# Patient Record
Sex: Male | Born: 1980 | Race: White | Hispanic: No | Marital: Married | Smoking: Never smoker
Health system: Southern US, Community
[De-identification: ages and names within clinical notes are randomized; demographics above are authoritative.]

## PROBLEM LIST (undated history)

## (undated) DIAGNOSIS — B019 Varicella without complication: Secondary | ICD-10-CM

## (undated) HISTORY — DX: Varicella without complication: B01.9

---

## 2009-07-09 HISTORY — PX: REFRACTIVE SURGERY: SHX103

## 2010-05-25 ENCOUNTER — Emergency Department (HOSPITAL_COMMUNITY): Admission: EM | Admit: 2010-05-25 | Discharge: 2010-05-25 | Payer: Self-pay | Admitting: Emergency Medicine

## 2010-11-23 ENCOUNTER — Ambulatory Visit
Admission: RE | Admit: 2010-11-23 | Discharge: 2010-11-23 | Disposition: A | Discharge status: Home / Self Care | Payer: No Typology Code available for payment source | Source: Ambulatory Visit | Attending: Occupational Medicine | Admitting: Occupational Medicine

## 2010-11-23 ENCOUNTER — Other Ambulatory Visit: Payer: Self-pay | Admitting: Occupational Medicine

## 2010-11-23 DIAGNOSIS — Z0000 Encounter for general adult medical examination without abnormal findings: Secondary | ICD-10-CM

## 2011-09-18 ENCOUNTER — Other Ambulatory Visit: Payer: Self-pay | Admitting: Occupational Medicine

## 2011-09-18 ENCOUNTER — Ambulatory Visit (INDEPENDENT_AMBULATORY_CARE_PROVIDER_SITE_OTHER): Payer: Self-pay | Admitting: Family Medicine

## 2011-09-18 ENCOUNTER — Encounter: Payer: Self-pay | Admitting: Family Medicine

## 2011-09-18 ENCOUNTER — Ambulatory Visit
Admission: RE | Admit: 2011-09-18 | Discharge: 2011-09-18 | Disposition: A | Discharge status: Home / Self Care | Payer: No Typology Code available for payment source | Source: Ambulatory Visit | Attending: Occupational Medicine | Admitting: Occupational Medicine

## 2011-09-18 VITALS — BP 139/77 | HR 86 | Temp 98.1°F | Ht 72.0 in | Wt 185.0 lb

## 2011-09-18 DIAGNOSIS — S8992XA Unspecified injury of left lower leg, initial encounter: Secondary | ICD-10-CM

## 2011-09-18 DIAGNOSIS — R609 Edema, unspecified: Secondary | ICD-10-CM

## 2011-09-18 DIAGNOSIS — R52 Pain, unspecified: Secondary | ICD-10-CM

## 2011-09-18 DIAGNOSIS — S99929A Unspecified injury of unspecified foot, initial encounter: Secondary | ICD-10-CM
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
## 2011-09-18 NOTE — Assessment & Plan Note (Signed)
consistent with Grade 1-2 MCL sprain.  Meniscal testing is negative as is testing of other knee ligaments.  To use immobilizer as needed.  Will take likely 2 weeks to recover, up to 4 weeks.  Discussed possibility of having a small meniscal tear but his exam is negative for this currently outside of medial pain.  ACL/PCL feel intact as well.  Agree with light duty for 2 weeks, relative rest, elevation.  F/u in 2 weeks or as needed.  Will continue regular follow-up with [facility].

## 2011-09-18 NOTE — Patient Instructions (Signed)
You have a mild MCL sprain of your left knee. At worst this is a grade 2 and can take up to 4 weeks to fully recover;  More likely you'll be back to normal in 2 weeks. Your ACL is intact and your meniscus testing is negative. Ice knee 15 minutes at a time as needed. Follow through with light duty for next 2 weeks. Wear knee brace at bedtime and when up/walking around if your knee feels unstable. Aleve 1-2 tabs twice a day as needed for pain/swelling. Follow up with me in 2 weeks or as needed. No cutting, jumping, twisting activities for next 2 weeks.

## 2012-08-09 HISTORY — PX: VASECTOMY: SHX75

## 2012-09-06 IMAGING — CR DG KNEE COMPLETE 4+V*L*
4 series · 4 of 4 positions shown · non-contrast
Comparison: None.

CLINICAL DATA: Left knee pain, injured running several days ago

LEFT KNEE - COMPLETE 4+ VIEW

[view not recorded (1 of 4)]
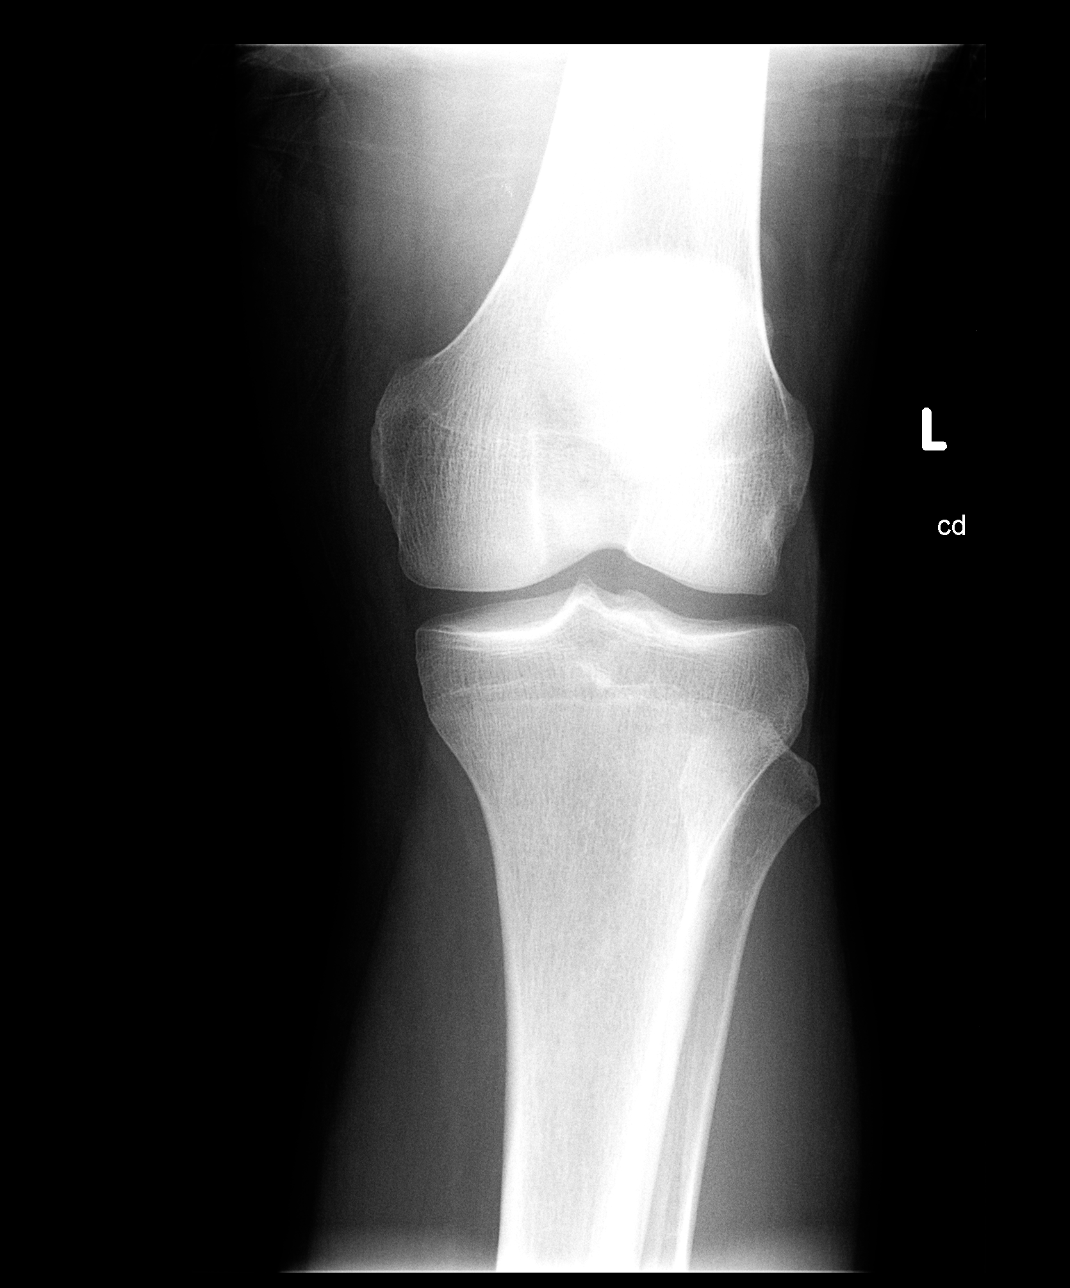

[view not recorded (2 of 4)]
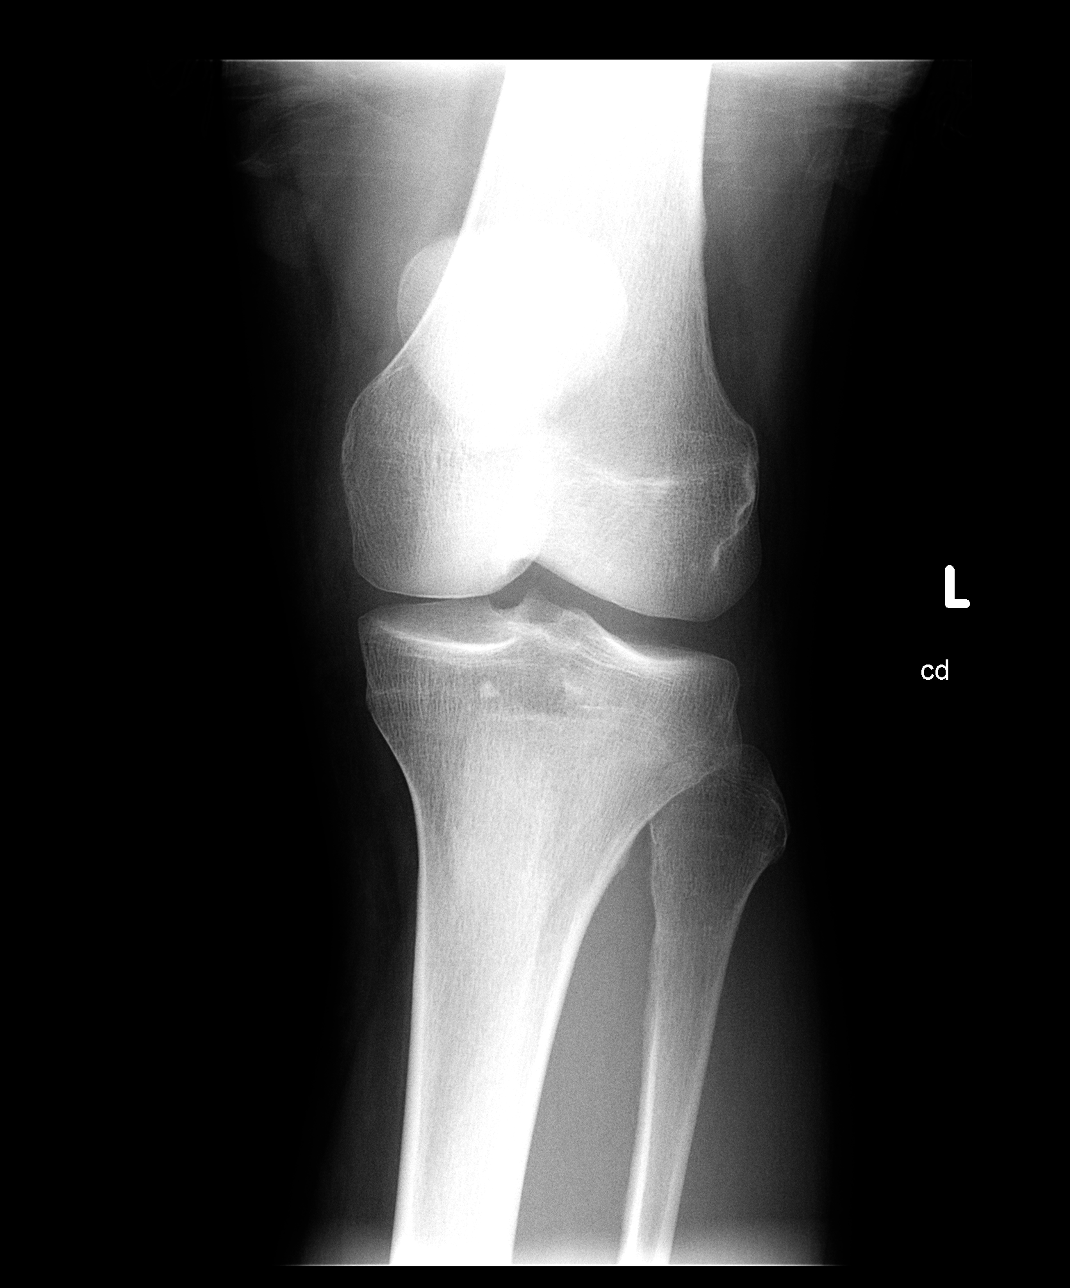

[view not recorded (3 of 4)]
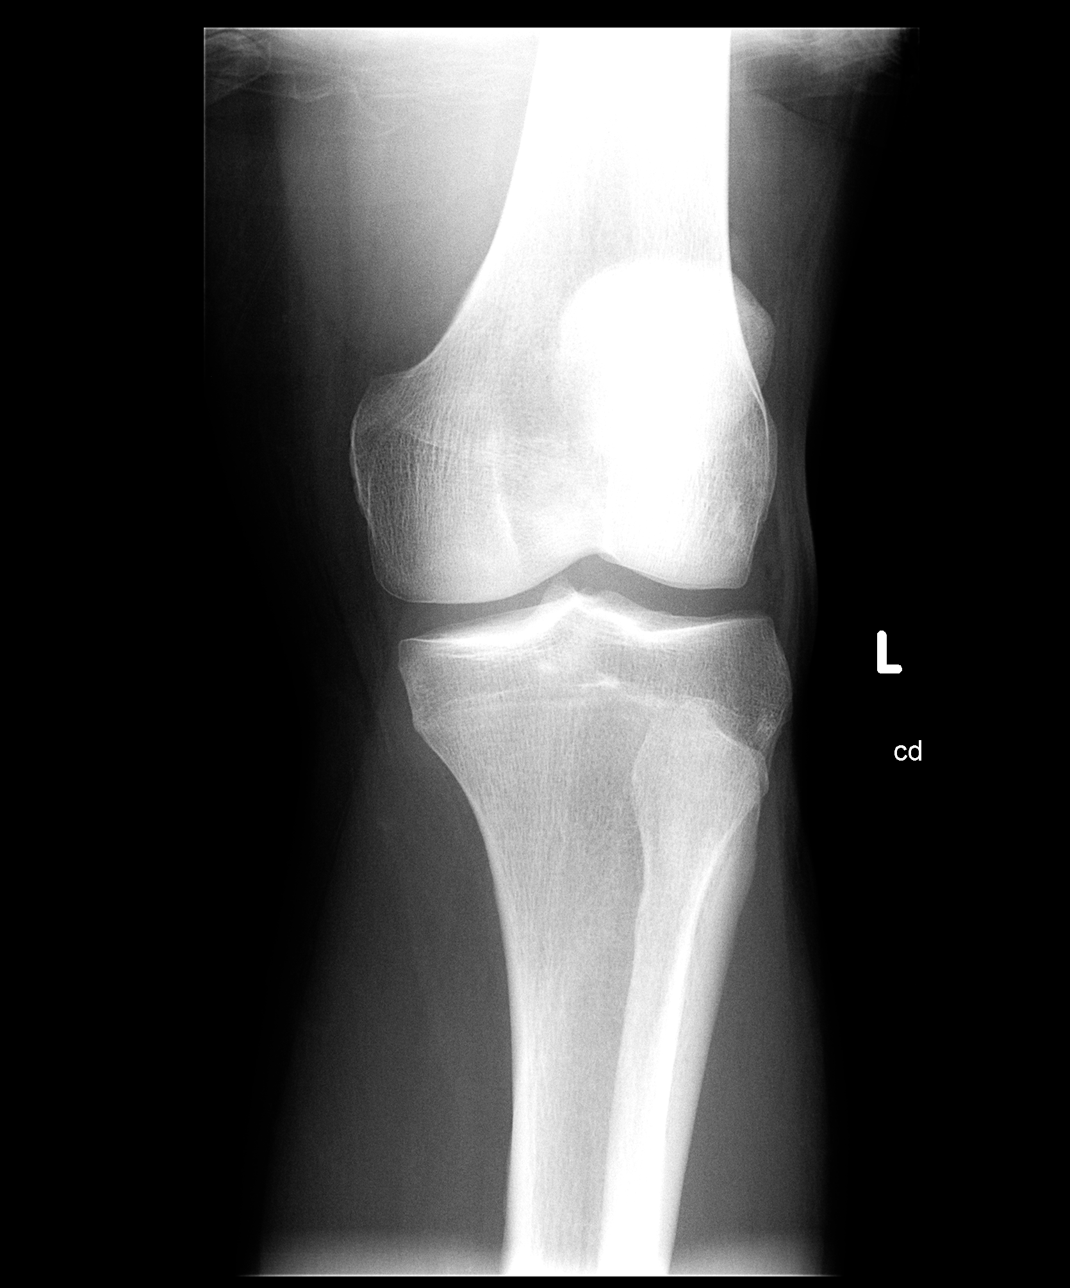

[view not recorded (4 of 4)]
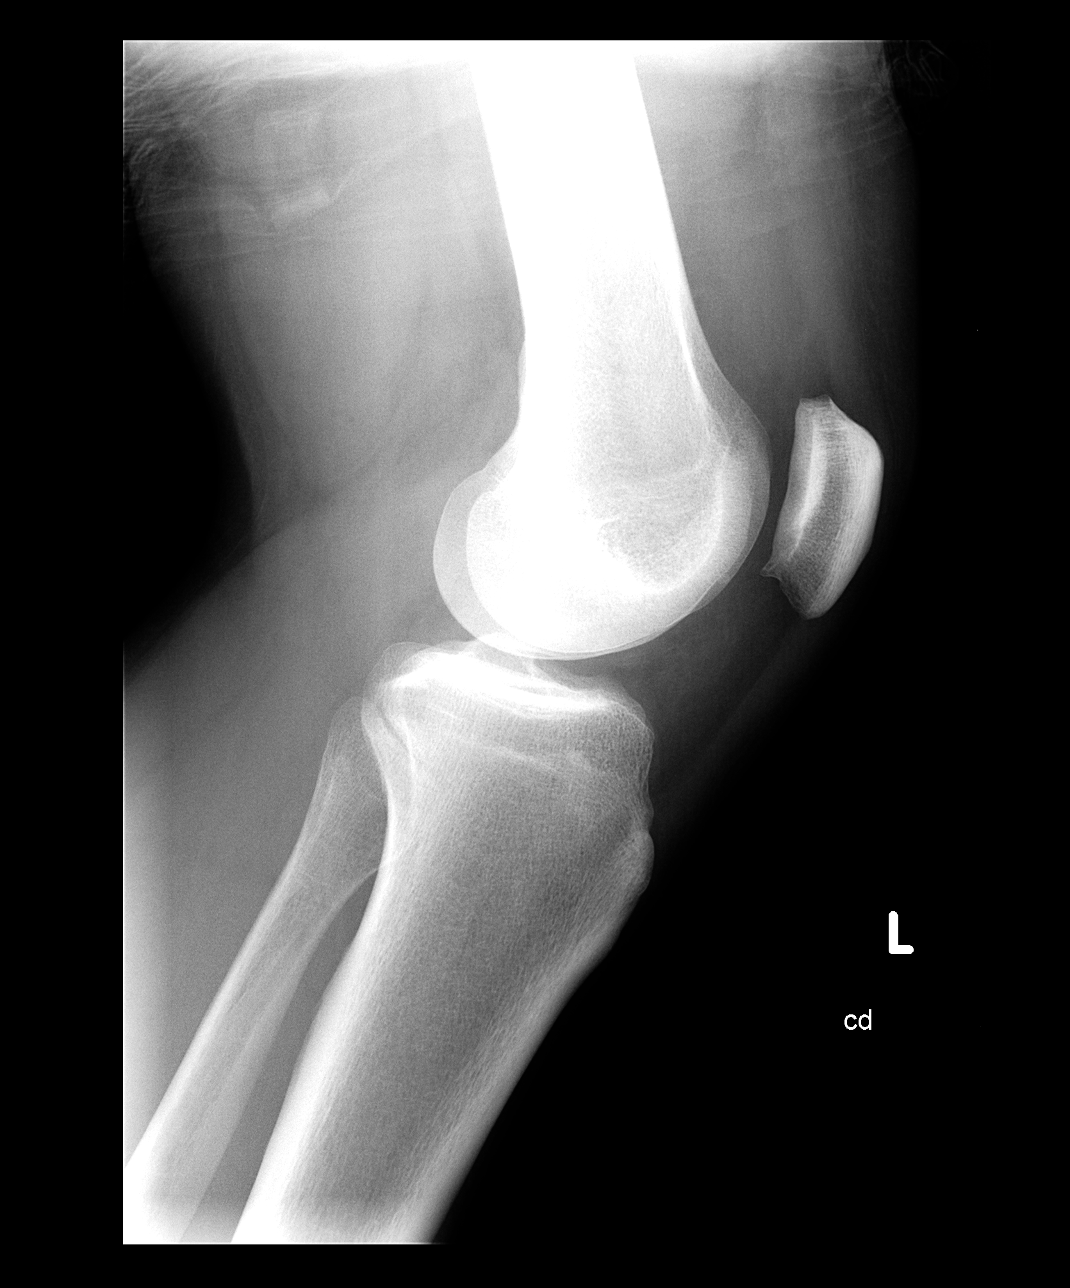

[4 of 4 positions shown; findings below may reference images not displayed]

FINDINGS: The knee joint spaces are well preserved and normal.  No
fracture is seen.  There may be a small knee joint effusion
present.
IMPRESSION: No fracture.  Cannot exclude small knee joint effusion.

## 2013-04-15 ENCOUNTER — Ambulatory Visit: Payer: Self-pay | Admitting: Physician Assistant

## 2013-04-17 ENCOUNTER — Ambulatory Visit (HOSPITAL_BASED_OUTPATIENT_CLINIC_OR_DEPARTMENT_OTHER)
Admission: RE | Admit: 2013-04-17 | Discharge: 2013-04-17 | Disposition: A | Discharge status: Home / Self Care | Payer: 59 | Source: Ambulatory Visit | Attending: Physician Assistant | Admitting: Physician Assistant

## 2013-04-17 ENCOUNTER — Ambulatory Visit (INDEPENDENT_AMBULATORY_CARE_PROVIDER_SITE_OTHER): Payer: 59 | Admitting: Physician Assistant

## 2013-04-17 ENCOUNTER — Encounter: Payer: Self-pay | Admitting: Physician Assistant

## 2013-04-17 ENCOUNTER — Telehealth: Payer: Self-pay | Admitting: Physician Assistant

## 2013-04-17 ENCOUNTER — Other Ambulatory Visit: Payer: Self-pay | Admitting: Physician Assistant

## 2013-04-17 VITALS — BP 108/72 | HR 57 | Temp 98.3°F | Resp 16 | Ht 72.0 in | Wt 192.2 lb

## 2013-04-17 DIAGNOSIS — N453 Epididymo-orchitis: Secondary | ICD-10-CM

## 2013-04-17 DIAGNOSIS — IMO0002 Reserved for concepts with insufficient information to code with codable children: Secondary | ICD-10-CM

## 2013-04-17 DIAGNOSIS — N509 Disorder of male genital organs, unspecified: Secondary | ICD-10-CM | POA: Insufficient documentation

## 2013-04-17 DIAGNOSIS — N451 Epididymitis: Secondary | ICD-10-CM

## 2013-04-17 DIAGNOSIS — Z0000 Encounter for general adult medical examination without abnormal findings: Secondary | ICD-10-CM

## 2013-04-17 DIAGNOSIS — IMO0001 Reserved for inherently not codable concepts without codable children: Secondary | ICD-10-CM

## 2013-04-17 DIAGNOSIS — S8992XS Unspecified injury of left lower leg, sequela: Secondary | ICD-10-CM

## 2013-04-17 DIAGNOSIS — Z9852 Vasectomy status: Secondary | ICD-10-CM | POA: Insufficient documentation

## 2013-04-17 DIAGNOSIS — R1031 Right lower quadrant pain: Secondary | ICD-10-CM

## 2013-04-17 LAB — POCT URINALYSIS DIPSTICK
Blood, UA: NEGATIVE
Glucose, UA: NEGATIVE
Protein, UA: NEGATIVE
Spec Grav, UA: 1.015
Urobilinogen, UA: 0.2

## 2013-04-17 MED ORDER — LEVOFLOXACIN 500 MG PO TABS: 500.0000 mg | ORAL_TABLET | Freq: Every day | ORAL | Status: DC

## 2013-04-17 NOTE — Patient Instructions (Signed)
Please go downstairs to Imaging department.  They will do a scrotal US at 4 pm.  Please take antibiotic as prescribed until all pills are gone.  I will test your urine for other causes of infection.  If they are positive we will treat accordingly.  Please continue Ibuprofen, Ice to testicle, and it would be beneficial to wear briefs to add support to the testicles.  Please return to office for fasting lab work.  We will call you with your results.

## 2013-04-17 NOTE — Progress Notes (Signed)
Patient ID: Javier Hinton, male   DOB: 1981-04-26, 32 y.o.   MRN: [id number]  Patient presents to clinic today to establish care.  Acute Concerns: Patient complains of pain of lower abdomen and right testicle x 1+ weeks.  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  Everything fine the day of assessment (Monday).Tuesday and that Wednesday he felt fine.  On wakingThurdsay he noticed extreme right testicle pain.  Hurt all day.  Friday, pain was slightly better but pain in abdomen.  The next day he went to gym before work.  States he felt fine.  Later in the day intense pain.  Aleve -- some relief.  No heavy lifting for past 2 days, pain is improving.  No swelling, mass noted by patient.  Endorses tenderness to palpation. Denies fever, chills, sweats, change to urinary or bowel habits.  Denies dysuria, hematuria, flank pain.  Denies history of hernia.  Chronic Issues: (1) Left Knee Injury -- 2013; no fracture or torn ligament; asymptomatic.  Health Maintenance: Dental -- 6 month check up -- no issues Vision -- 1 year ago.  No issues -- had Lasix Immunizations --  Tetanus 2012.  Declines flu shot.   Past Medical History  Diagnosis Date  . Chicken pox     No current outpatient prescriptions on file prior to visit.   No current facility-administered medications on file prior to visit.    No Known Allergies  Family History  Problem Relation Age of Onset  . Diabetes Neg Hx   . Heart attack Neg Hx   . Hyperlipidemia Neg Hx   . Hypertension Father   . Sudden death Neg Hx   . Healthy Mother   . COPD Maternal Grandfather   . Kidney cancer Father     2010  . Liver disease Maternal Aunt   . Healthy Sister     x2    History   Social History  . Marital Status: Married    Spouse Name: N/A    Number of Children: N/A  . Years of Education: N/A   Social History Main Topics  . Smoking status: Never Smoker   . Smokeless tobacco: Current User    Types:  Chew  . Alcohol Use: 3.6 oz/week    6 Cans of beer per week  . Drug Use: No  . Sexual Activity: Yes    Birth Control/ Protection: Surgical   Other Topics Concern  . None   Social History Narrative  . None   Review of Systems  Constitutional: Negative for fever, chills, weight loss and malaise/fatigue.  HENT: Negative for ear discharge, ear pain, hearing loss and tinnitus.   Eyes: Negative for blurred vision, double vision, photophobia and pain.  Respiratory: Negative for cough, shortness of breath and wheezing.   Cardiovascular: Negative for chest pain and palpitations.  Gastrointestinal: Negative for heartburn, nausea, vomiting, abdominal pain, diarrhea, constipation, blood in stool and melena.  Genitourinary: Negative for dysuria, urgency, frequency, hematuria and flank pain.  Musculoskeletal: Negative for joint pain.  Neurological: Negative for dizziness, seizures, loss of consciousness and headaches.  Endo/Heme/Allergies: Negative for environmental allergies.  Psychiatric/Behavioral: Negative for depression, suicidal ideas, hallucinations and substance abuse. The patient is not nervous/anxious and does not have insomnia.    Filed Vitals:   04/17/13 1433  BP: 108/72  Pulse: 57  Temp: 98.3 F (36.8 C)  Resp: 16    Physical Exam  Vitals reviewed. Constitutional: He is oriented to person,  place, and time and well-developed, well-nourished, and in no distress.  HENT:  Head: Normocephalic and atraumatic.  Right Ear: External ear normal.  Left Ear: External ear normal.  Nose: Nose normal.  Mouth/Throat: Oropharynx is clear and moist. No oropharyngeal exudate.  Tympanic membranes within normal limits bilaterally.  Eyes: Conjunctivae and EOM are normal. Pupils are equal, round, and reactive to light.  Neck: Normal range of motion. Neck supple.  Cardiovascular: Normal rate, regular rhythm, normal heart sounds and intact distal pulses.   Pulmonary/Chest: Effort normal and  breath sounds normal. No respiratory distress. He has no wheezes. He has no rales. He exhibits no tenderness.  Abdominal: Soft. Bowel sounds are normal. He exhibits no distension and no mass. There is no tenderness. There is no rebound and no guarding.  Genitourinary: Penis normal. He exhibits testicular tenderness and epididymal tenderness. He exhibits no abnormal testicular mass, no abnormal scrotal mass and no scrotal tenderness. No discharge found.  Right epididymis enlarged.  Lymphadenopathy:    He has no cervical adenopathy.  Neurological: He is alert and oriented to person, place, and time.  Skin: Skin is warm and dry. No rash noted.  Psychiatric: Affect normal.     Recent Results (from the past 2160 hour(s))  URINALYSIS, ROUTINE W REFLEX MICROSCOPIC     Status: None   Collection Time    04/17/13  3:20 PM      Result Value Range   Color, Urine YELLOW  YELLOW   APPearance CLEAR  CLEAR   Specific Gravity, Urine 1.013  1.005 - 1.030   pH 6.0  5.0 - 8.0   Glucose, UA NEG  NEG mg/dL   Bilirubin Urine NEG  NEG   Ketones, ur NEG  NEG mg/dL   Hgb urine dipstick NEG  NEG   Protein, ur NEG  NEG mg/dL   Urobilinogen, UA 0.2  0.0 - 1.0 mg/dL   Nitrite NEG  NEG   Leukocytes, UA NEG  NEG  POCT URINALYSIS DIPSTICK     Status: None   Collection Time    04/17/13  3:29 PM      Result Value Range   Color, UA Golden     Clarity, UA clear     Glucose, UA neg     Bilirubin, UA neg     Ketones, UA neg     Spec Grav, UA 1.015     Blood, UA neg     pH, UA 6.0     Protein, UA neg     Urobilinogen, UA 0.2     Nitrite, UA neg     Leukocytes, UA Negative      Assessment/Plan: Epididymitis, right Urine dipstick negative. We'll send for culture and microscopy.  Patient denies concern for STD. Wishes to defer empiric treatment for GC infection until test is obtained. We'll obtain urine GC. Treat empirically with Levaquin non-STD cause of epididymitis. Will obtain scrotal ultrasound to rule  out other mass, hernia or cause for pain.  Ibuprofen for pain. Ice compresses to affected area. Supportive undergarments.  Left knee injury Asymptomatic at present.  Encounter for preventive health examination Patient to return for fasting labs.

## 2013-04-17 NOTE — Telephone Encounter (Signed)
Discussed results of scrotal US with patient.  Urine culture and urine G/C pending.  Continue Levaquin for 10 days.  Ice,  Ibuprofen, supportive underwear.  Will call patient with further results and tailor treatment accordingly.

## 2013-04-18 DIAGNOSIS — Z0000 Encounter for general adult medical examination without abnormal findings: Secondary | ICD-10-CM | POA: Insufficient documentation

## 2013-04-18 DIAGNOSIS — N451 Epididymitis: Secondary | ICD-10-CM | POA: Insufficient documentation

## 2013-04-18 LAB — URINALYSIS, ROUTINE W REFLEX MICROSCOPIC
Hgb urine dipstick: NEGATIVE
Ketones, ur: NEGATIVE mg/dL
Nitrite: NEGATIVE
Urobilinogen, UA: 0.2 mg/dL (ref 0.0–1.0)

## 2013-04-18 NOTE — Assessment & Plan Note (Addendum)
Urine dipstick negative. We'll send for culture and microscopy.  Patient denies concern for STD. Wishes to defer empiric treatment for GC infection until test is obtained. We'll obtain urine GC. Treat empirically with Levaquin non-STD cause of epididymitis. Will obtain scrotal ultrasound to rule out other mass, hernia or cause for pain.  Ibuprofen for pain. Ice compresses to affected area. Supportive undergarments.

## 2013-04-18 NOTE — Assessment & Plan Note (Signed)
Patient to return for fasting labs 

## 2013-04-18 NOTE — Assessment & Plan Note (Signed)
Asymptomatic at present.

## 2013-04-19 LAB — CULTURE, URINE COMPREHENSIVE
Colony Count: NO GROWTH
Organism ID, Bacteria: NO GROWTH

## 2013-04-21 LAB — GC/CHLAMYDIA PROBE AMP: GC Probe RNA: NEGATIVE

## 2013-04-22 ENCOUNTER — Telehealth: Payer: Self-pay | Admitting: *Deleted

## 2013-04-22 DIAGNOSIS — Z0000 Encounter for general adult medical examination without abnormal findings: Secondary | ICD-10-CM

## 2013-04-22 NOTE — Telephone Encounter (Signed)
Message copied by Regis Bill on Wed Apr 22, 2013  9:42 AM ------      Message from: Marcelline Mates      Created: Tue Apr 21, 2013  4:48 PM       Please inform patient that urine G/C were negative.  He should start feeling better with the Levaquin, Ibuprofen, Elevation and Ice compresses.  If symptoms persist despite treatment, he will need a urology referral ------

## 2013-04-22 NOTE — Telephone Encounter (Signed)
Patient informed, understood; future lab orders placed/SLS

## 2013-04-24 LAB — URINALYSIS, ROUTINE W REFLEX MICROSCOPIC
Hgb urine dipstick: NEGATIVE
Ketones, ur: NEGATIVE mg/dL
Nitrite: NEGATIVE
Protein, ur: NEGATIVE mg/dL
Urobilinogen, UA: 0.2 mg/dL (ref 0.0–1.0)

## 2013-04-24 LAB — CBC WITH DIFFERENTIAL/PLATELET
Basophils Relative: 1 % (ref 0–1)
HCT: 44 % (ref 39.0–52.0)
Hemoglobin: 15.3 g/dL (ref 13.0–17.0)
Lymphs Abs: 1.3 10*3/uL (ref 0.7–4.0)
MCHC: 34.8 g/dL (ref 30.0–36.0)
Monocytes Absolute: 0.4 10*3/uL (ref 0.1–1.0)
Monocytes Relative: 8 % (ref 3–12)
Neutro Abs: 2.4 10*3/uL (ref 1.7–7.7)

## 2013-04-24 LAB — HEPATIC FUNCTION PANEL
ALT: 14 U/L (ref 0–53)
AST: 21 U/L (ref 0–37)
Albumin: 4.5 g/dL (ref 3.5–5.2)
Bilirubin, Direct: 0.2 mg/dL (ref 0.0–0.3)
Indirect Bilirubin: 0.6 mg/dL (ref 0.0–0.9)
Total Bilirubin: 0.8 mg/dL (ref 0.3–1.2)
Total Protein: 6.4 g/dL (ref 6.0–8.3)

## 2013-04-24 LAB — BASIC METABOLIC PANEL
BUN: 17 mg/dL (ref 6–23)
CO2: 29 mEq/L (ref 19–32)
Chloride: 103 mEq/L (ref 96–112)
Glucose, Bld: 91 mg/dL (ref 70–99)
Potassium: 5.3 mEq/L (ref 3.5–5.3)

## 2013-04-24 LAB — LIPID PANEL
Cholesterol: 149 mg/dL (ref 0–200)
LDL Cholesterol: 70 mg/dL (ref 0–99)
VLDL: 9 mg/dL (ref 0–40)

## 2013-04-27 NOTE — Telephone Encounter (Signed)
Quick Note:  Called and spoke with pt and pt is aware. ______ 

## 2014-04-06 IMAGING — US US SCROTUM
1 series · 13 of 25 positions shown · non-contrast
Comparison: None.

CLINICAL DATA: 31-year-old male with testicular pain, right greater
than left. History of vasectomy.

EXAM:
SCROTAL ULTRASOUND
DOPPLER ULTRASOUND OF THE TESTICLES
TECHNIQUE: Complete ultrasound examination of the testicles, epididymis, and
other scrotal structures was performed. Color and spectral Doppler
ultrasound were also utilized to evaluate blood flow to the
testicles.

[Series 1: us scrotum · 0.08mm/px · 13 of 28 slices shown]
[im 1/28]
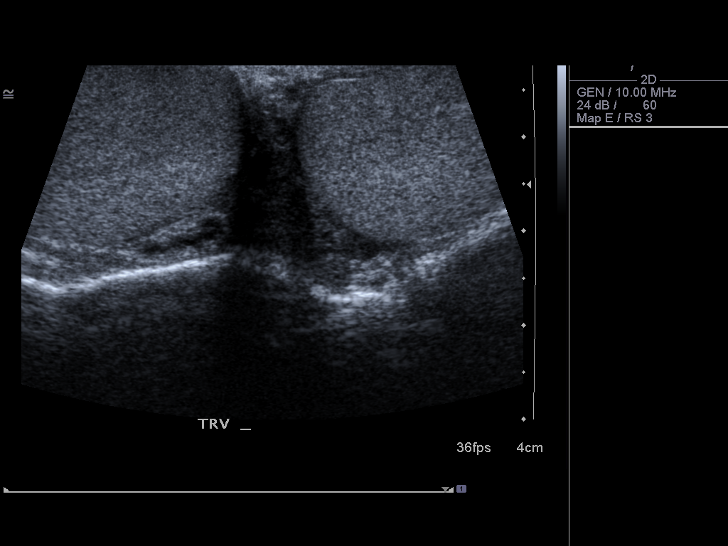
[im 3/28]
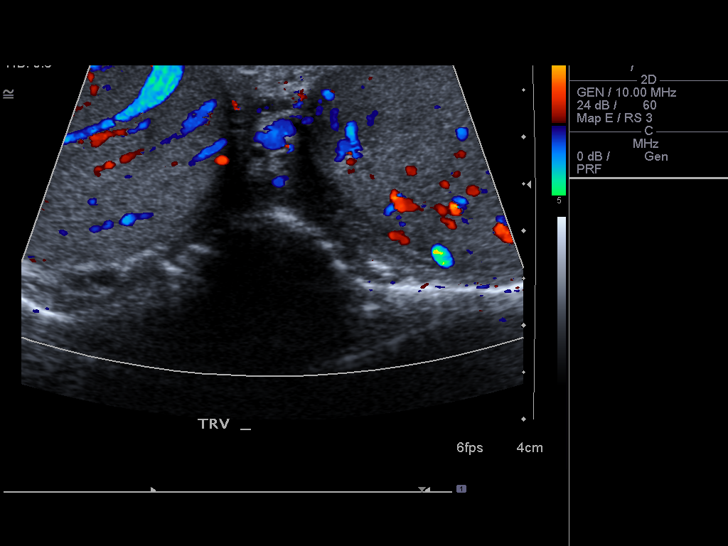
[im 5/28]
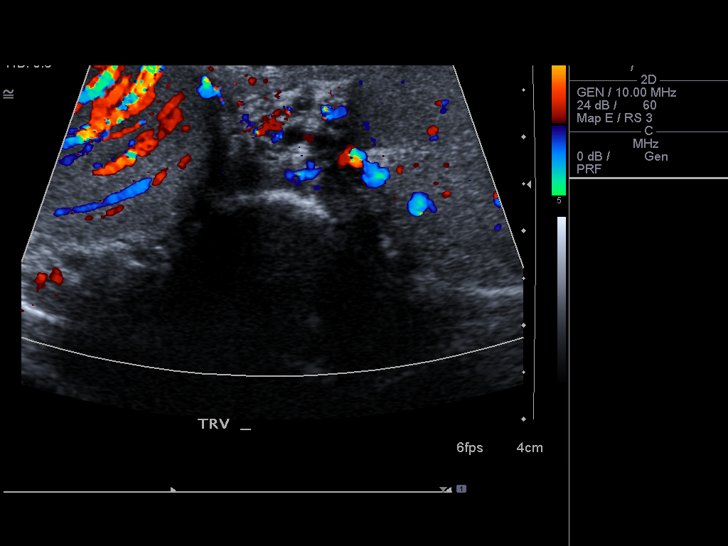
[im 7/28]
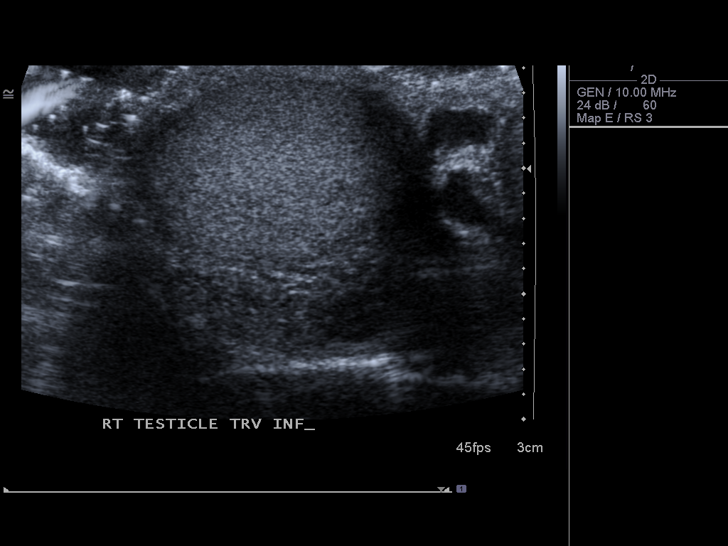
[im 10/28]
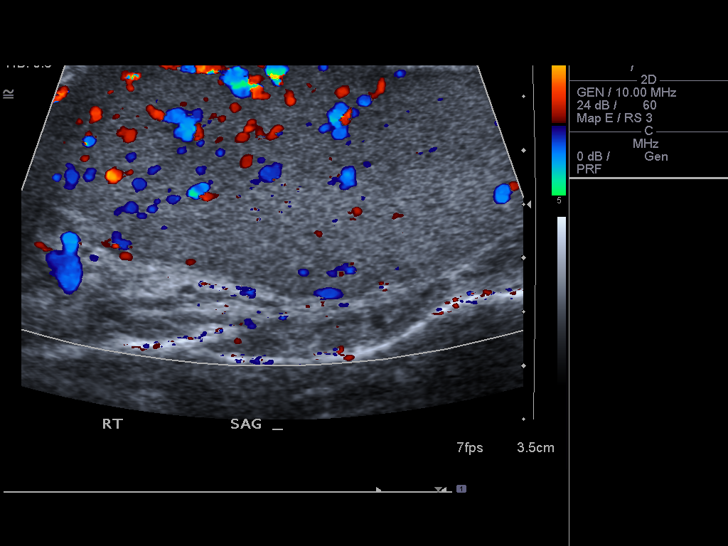
[im 12/28]
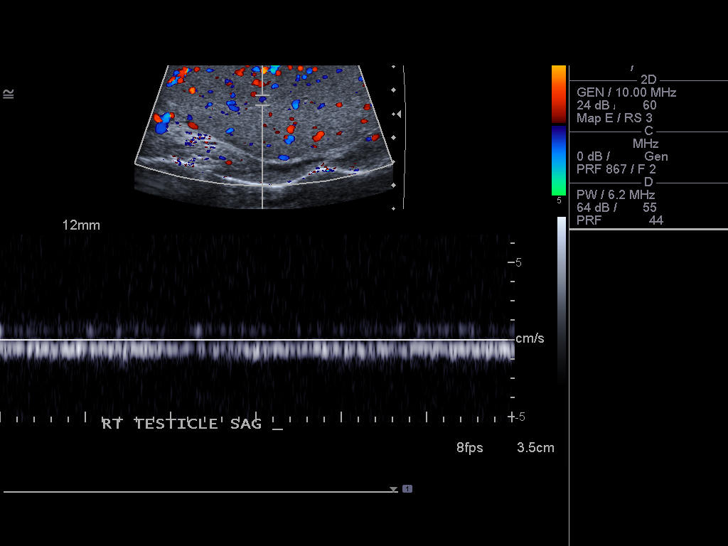
[im 14/28]
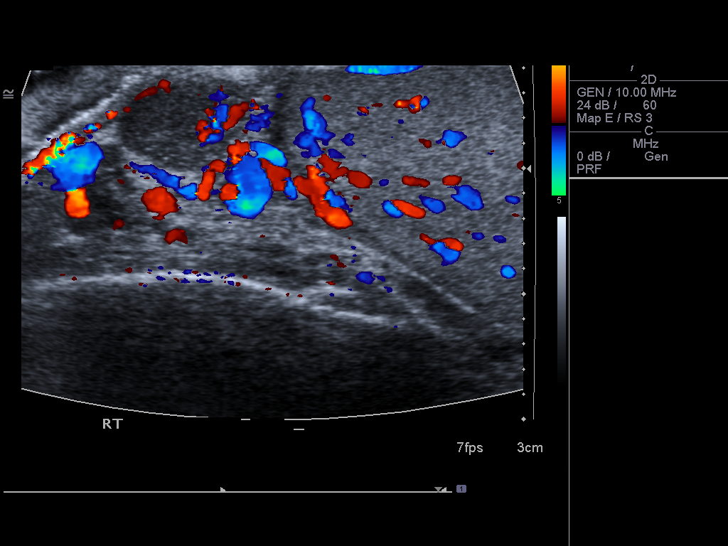
[im 16/28]
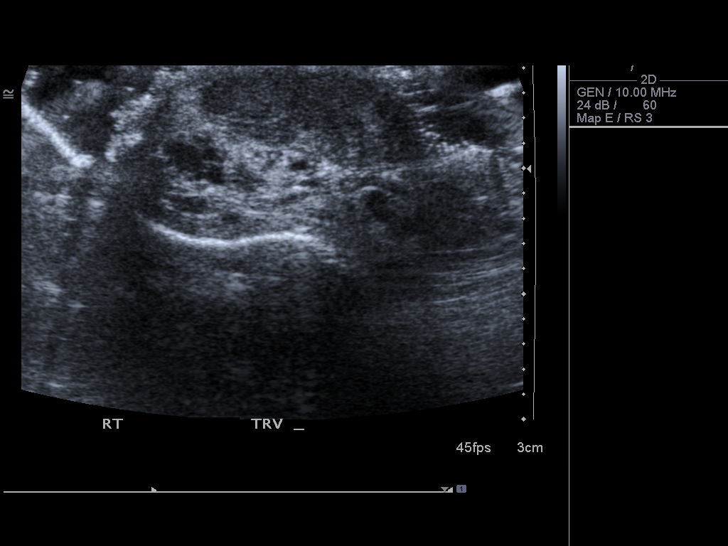
[im 19/28]
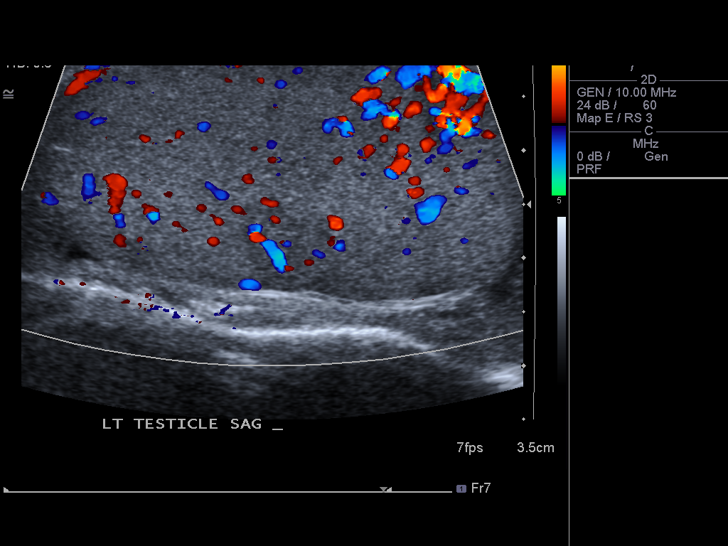
[im 21/28]
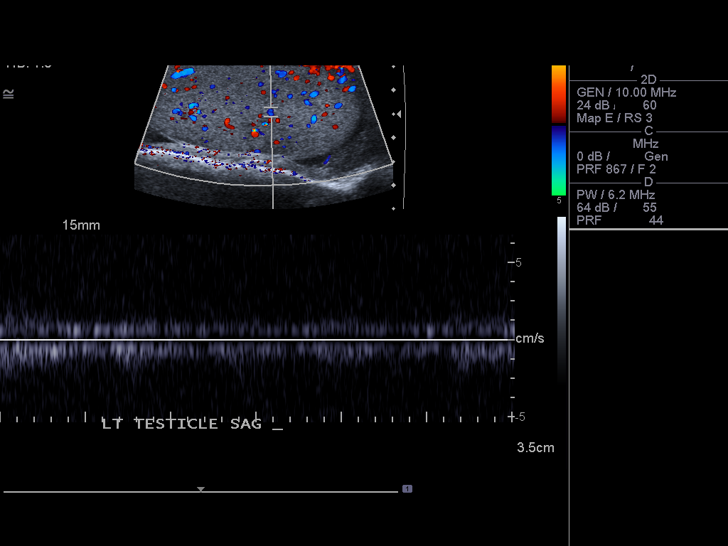
[im 23/28]
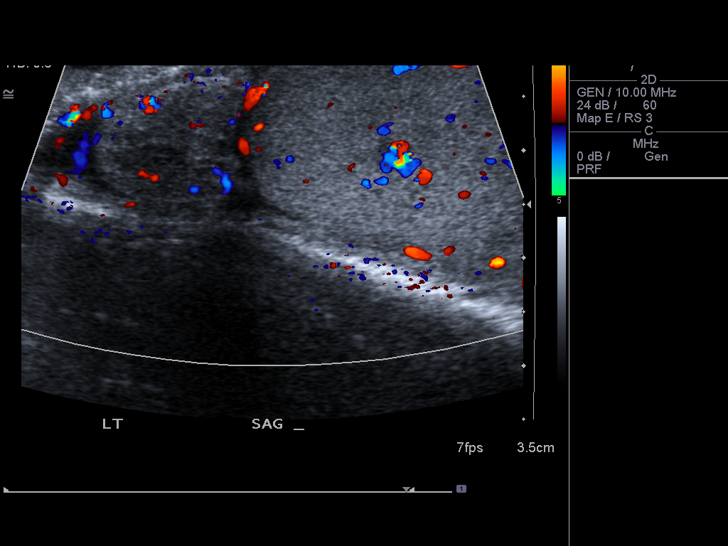
[im 25/28]
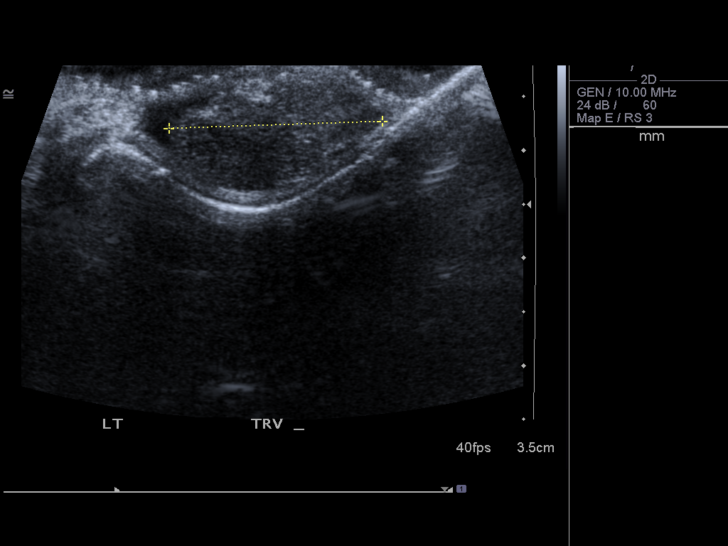
[im 28/28]
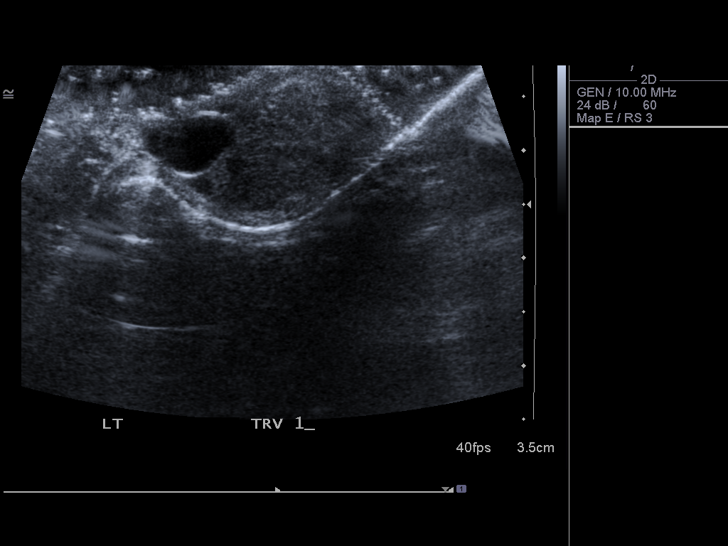

[13 of 25 positions shown; findings below may reference images not displayed]

FINDINGS: Right testis: The right testicle is normal in echogenicity and size.
No focal masses or abnormalities noted.

Color Doppler flow and arterial/venous waveforms are noted. Slightly
increased flow to the right testicle was compared to the left is
noted.

Left testis: Left testicle is normal in echogenicity and size. No
focal masses are abnormalities noted. Normal color Doppler flow and
arterial/ venous waveforms are noted.

Right epididymis: The right epididymis is mildly enlarged with
increased vascularity.

Left epididymis:  A 7 mm spermatocele/epididymal cyst is present.

Hydrocele:  Absent

Varicocele:  Absent

Pulsed Doppler interrogation of both testes demonstrates low
resistance arterial and venous waveforms bilaterally.
IMPRESSION: Mildly prominent right epididymis with increased vascular flow
compatible with epididymitis. Slightly increased vascularity within
the right testicle may represent right orchitis.

No evidence of testicular torsion or focal testicular abnormality.

## 2014-12-19 ENCOUNTER — Emergency Department (HOSPITAL_COMMUNITY)
Admission: EM | Admit: 2014-12-19 | Discharge: 2014-12-19 | Disposition: A | Discharge status: Home / Self Care | Payer: Worker's Compensation | Attending: Emergency Medicine | Admitting: Emergency Medicine

## 2014-12-19 ENCOUNTER — Encounter (HOSPITAL_COMMUNITY): Payer: Self-pay | Admitting: *Deleted

## 2014-12-19 DIAGNOSIS — Z792 Long term (current) use of antibiotics: Secondary | ICD-10-CM | POA: Insufficient documentation

## 2014-12-19 DIAGNOSIS — Z8619 Personal history of other infectious and parasitic diseases: Secondary | ICD-10-CM | POA: Diagnosis not present

## 2014-12-19 DIAGNOSIS — Z0000 Encounter for general adult medical examination without abnormal findings: Secondary | ICD-10-CM | POA: Insufficient documentation

## 2014-12-19 NOTE — Discharge Instructions (Signed)
Health Maintenance A healthy lifestyle and preventative care can promote health and wellness.  Maintain regular health, dental, and eye exams.  Eat a healthy diet. Foods like vegetables, fruits, whole grains, low-fat dairy products, and lean protein foods contain the nutrients you need and are low in calories. Decrease your intake of foods high in solid fats, added sugars, and salt. Get information about a proper diet from your health care provider, if necessary.  Regular physical exercise is one of the most important things you can do for your health. Most adults should get at least 150 minutes of moderate-intensity exercise (any activity that increases your heart rate and causes you to sweat) each week. In addition, most adults need muscle-strengthening exercises on 2 or more days a week.   Maintain a healthy weight. The body mass index (BMI) is a screening tool to identify possible weight problems. It provides an estimate of body fat based on height and weight. Your health care provider can find your BMI and can help you achieve or maintain a healthy weight. For males 20 years and older:  A BMI below 18.5 is considered underweight.  A BMI of 18.5 to 24.9 is normal.  A BMI of 25 to 29.9 is considered overweight.  A BMI of 30 and above is considered obese.  Maintain normal blood lipids and cholesterol by exercising and minimizing your intake of saturated fat. Eat a balanced diet with plenty of fruits and vegetables. Blood tests for lipids and cholesterol should begin at age 74 and be repeated every 5 years. If your lipid or cholesterol levels are high, you are over age 40, or you are at high risk for heart disease, you may need your cholesterol levels checked more frequently.Ongoing high lipid and cholesterol levels should be treated with medicines if diet and exercise are not working.  If you smoke, find out from your health care provider how to quit. If you do not use tobacco, do not  start.  Lung cancer screening is recommended for adults aged 55-80 years who are at high risk for developing lung cancer because of a history of smoking. A yearly low-dose CT scan of the lungs is recommended for people who have at least a 30-pack-year history of smoking and are current smokers or have quit within the past 15 years. A pack year of smoking is smoking an average of 1 pack of cigarettes a day for 1 year (for example, a 30-pack-year history of smoking could mean smoking 1 pack a day for 30 years or 2 packs a day for 15 years). Yearly screening should continue until the smoker has stopped smoking for at least 15 years. Yearly screening should be stopped for people who develop a health problem that would prevent them from having lung cancer treatment.  If you choose to drink alcohol, do not have more than 2 drinks per day. One drink is considered to be 12 oz (360 mL) of beer, 5 oz (150 mL) of wine, or 1.5 oz (45 mL) of liquor.  Avoid the use of street drugs. Do not share needles with anyone. Ask for help if you need support or instructions about stopping the use of drugs.  High blood pressure causes heart disease and increases the risk of stroke. Blood pressure should be checked at least every 1-2 years. Ongoing high blood pressure should be treated with medicines if weight loss and exercise are not effective.  If you are 75-38 years old, ask your health care provider if  you should take aspirin to prevent heart disease.  Diabetes screening involves taking a blood sample to check your fasting blood sugar level. This should be done once every 3 years after age 35 if you are at a normal weight and without risk factors for diabetes. Testing should be considered at a younger age or be carried out more frequently if you are overweight and have at least 1 risk factor for diabetes.  Colorectal cancer can be detected and often prevented. Most routine colorectal cancer screening begins at the age of 35  and continues through age 35. However, your health care provider may recommend screening at an earlier age if you have risk factors for colon cancer. On a yearly basis, your health care provider may provide home test kits to check for hidden blood in the stool. A small camera at the end of a tube may be used to directly examine the colon (sigmoidoscopy or colonoscopy) to detect the earliest forms of colorectal cancer. Talk to your health care provider about this at age 35 when routine screening begins. A direct exam of the colon should be repeated every 5-10 years through age 35, unless early forms of precancerous polyps or small growths are found.  People who are at an increased risk for hepatitis B should be screened for this virus. You are considered at high risk for hepatitis B if:  You were born in a country where hepatitis B occurs often. Talk with your health care provider about which countries are considered high risk.  Your parents were born in a high-risk country and you have not received a shot to protect against hepatitis B (hepatitis B vaccine).  You have HIV or AIDS.  You use needles to inject street drugs.  You live with, or have sex with, someone who has hepatitis B.  You are a man who has sex with other men (MSM).  You get hemodialysis treatment.  You take certain medicines for conditions like cancer, organ transplantation, and autoimmune conditions.  Hepatitis C blood testing is recommended for all people born from [id number] through 1966 and any individual with known risk factors for hepatitis C.  Healthy men should no longer receive prostate-specific antigen (PSA) blood tests as part of routine cancer screening. Talk to your health care provider about prostate cancer screening.  Testicular cancer screening is not recommended for adolescents or adult males who have no symptoms. Screening includes self-exam, a health care provider exam, and other screening tests. Consult with your  health care provider about any symptoms you have or any concerns you have about testicular cancer.  Practice safe sex. Use condoms and avoid high-risk sexual practices to reduce the spread of sexually transmitted infections (STIs).  You should be screened for STIs, including gonorrhea and chlamydia if:  You are sexually active and are younger than 24 years.  You are older than 24 years, and your health care provider tells you that you are at risk for this type of infection.  Your sexual activity has changed since you were last screened, and you are at an increased risk for chlamydia or gonorrhea. Ask your health care provider if you are at risk.  If you are at risk of being infected with HIV, it is recommended that you take a prescription medicine daily to prevent HIV infection. This is called pre-exposure prophylaxis (PrEP). You are considered at risk if:  You are a man who has sex with other men (MSM).  You are a heterosexual man who  is sexually active with multiple partners.  You take drugs by injection.  You are sexually active with a partner who has HIV.  Talk with your health care provider about whether you are at high risk of being infected with HIV. If you choose to begin PrEP, you should first be tested for HIV. You should then be tested every 3 months for as long as you are taking PrEP.  Use sunscreen. Apply sunscreen liberally and repeatedly throughout the day. You should seek shade when your shadow is shorter than you. Protect yourself by wearing long sleeves, pants, a wide-brimmed hat, and sunglasses year round whenever you are outdoors.  Tell your health care provider of new moles or changes in moles, especially if there is a change in shape or color. Also, tell your health care provider if a mole is larger than the size of a pencil eraser.  A one-time screening for abdominal aortic aneurysm (AAA) and surgical repair of large AAAs by ultrasound is recommended for men aged  65-75 years who are current or former smokers.  Stay current with your vaccines (immunizations). Document Released: 12/22/2007 Document Revised: 06/30/2013 Document Reviewed: 11/20/2010 [facility] Patient Information 2015 [place], [place]. This information is not intended to replace advice given to you by your health care provider. Make sure you discuss any questions you have with your health care provider. Stress Stress-related medical problems are becoming increasingly common. The body has a built-in physical response to stressful situations. Faced with pressure, challenge or danger, we need to react quickly. Our bodies release hormones such as cortisol and adrenaline to help do this. These hormones are part of the "fight or flight" response and affect the metabolic rate, heart rate and blood pressure, resulting in a heightened, stressed state that prepares the body for optimum performance in dealing with a stressful situation. It is likely that early man required these mechanisms to stay alive, but usually modern stresses do not call for this, and the same hormones released in today's world can damage health and reduce coping ability. CAUSES  Pressure to perform at work, at school or in sports.  Threats of physical violence.  Money worries.  Arguments.  Family conflicts.  Divorce or separation from significant other.  Bereavement.  New job or unemployment.  Changes in location.  Alcohol or drug abuse. SOMETIMES, THERE IS NO PARTICULAR REASON FOR DEVELOPING STRESS. Almost all people are at risk of being stressed at some time in their lives. It is important to know that some stress is temporary and some is long term.  Temporary stress will go away when a situation is resolved. Most people can cope with short periods of stress, and it can often be relieved by relaxing, taking a walk or getting any type of exercise, chatting through issues with friends, or having a good night's  sleep.  Chronic (long-term, continuous) stress is much harder to deal with. It can be psychologically and emotionally damaging. It can be harmful both for an individual and for friends and family. SYMPTOMS Everyone reacts to stress differently. There are some common effects that help [place] recognize it. In times of extreme stress, people may:  Shake uncontrollably.  Breathe faster and deeper than normal (hyperventilate).  Vomit.  For people with asthma, stress can trigger an attack.  For some people, stress may trigger migraine headaches, ulcers, and body pain. PHYSICAL EFFECTS OF STRESS MAY INCLUDE:  Loss of energy.  Skin problems.  Aches and pains resulting from tense muscles, including neck  ache, backache and tension headaches.  Increased pain from arthritis and other conditions.  Irregular heart beat (palpitations).  Periods of irritability or anger.  Apathy or depression.  Anxiety (feeling uptight or worrying).  Unusual behavior.  Loss of appetite.  Comfort eating.  Lack of concentration.  Loss of, or decreased, sex-drive.  Increased smoking, drinking, or recreational drug use.  For women, missed periods.  Ulcers, joint pain, and muscle pain. Post-traumatic stress is the stress caused by any serious accident, strong emotional damage, or extremely difficult or violent experience such as rape or war. Post-traumatic stress victims can experience mixtures of emotions such as fear, shame, depression, guilt or anger. It may include recurrent memories or images that may be haunting. These feelings can last for weeks, months or even years after the traumatic event that triggered them. Specialized treatment, possibly with medicines and psychological therapies, is available. If stress is causing physical symptoms, severe distress or making it difficult for you to function as normal, it is worth seeing your caregiver. It is important to remember that although stress is a  usual part of life, extreme or prolonged stress can lead to other illnesses that will need treatment. It is better to visit a doctor sooner rather than later. Stress has been linked to the development of high blood pressure and heart disease, as well as insomnia and depression. There is no diagnostic test for stress since everyone reacts to it differently. But a caregiver will be able to spot the physical symptoms, such as:  Headaches.  Shingles.  Ulcers. Emotional distress such as intense worry, low mood or irritability should be detected when the doctor asks pertinent questions to identify any underlying problems that might be the cause. In case there are physical reasons for the symptoms, the doctor may also want to do some tests to exclude certain conditions. If you feel that you are suffering from stress, try to identify the aspects of your life that are causing it. Sometimes you may not be able to change or avoid them, but even a small change can have a positive ripple effect. A simple lifestyle change can make all the difference. STRATEGIES THAT CAN HELP DEAL WITH STRESS:  Delegating or sharing responsibilities.  Avoiding confrontations.  Learning to be more assertive.  Regular exercise.  Avoid using alcohol or street drugs to cope.  Eating a healthy, balanced diet, rich in fruit and vegetables and proteins.  Finding humor or absurdity in stressful situations.  Never taking on more than you know you can handle comfortably.  Organizing your time better to get as much done as possible.  Talking to friends or family and sharing your thoughts and fears.  Listening to music or relaxation tapes.  Relaxation techniques like deep breathing, meditation, and yoga.  Tensing and then relaxing your muscles, starting at the toes and working up to the head and neck. If you think that you would benefit from help, either in identifying the things that are causing your stress or in learning  techniques to help you relax, see a caregiver who is capable of helping you with this. Rather than relying on medications, it is usually better to try and identify the things in your life that are causing stress and try to deal with them. There are many techniques of managing stress including counseling, psychotherapy, aromatherapy, yoga, and exercise. Your caregiver can help you determine what is best for you. Document Released: 09/15/2002 Document Revised: 06/30/2013 Document Reviewed: 08/12/2007 [employer] Patient Information 2015  [employer]. This information is not intended to replace advice given to you by your health care provider. Make sure you discuss any questions you have with your health care provider. Hypertension Hypertension, commonly called high blood pressure, is when the force of blood pumping through your arteries is too strong. Your arteries are the blood vessels that carry blood from your heart throughout your body. A blood pressure reading consists of a higher number over a lower number, such as 110/72. The higher number (systolic) is the pressure inside your arteries when your heart pumps. The lower number (diastolic) is the pressure inside your arteries when your heart relaxes. Ideally you want your blood pressure below 120/80. Hypertension forces your heart to work harder to pump blood. Your arteries may become narrow or stiff. Having hypertension puts you at risk for heart disease, stroke, and other problems.  RISK FACTORS Some risk factors for high blood pressure are controllable. Others are not.  Risk factors you cannot control include:   Race. You may be at higher risk if you are African American.  Age. Risk increases with age.  Gender. Men are at higher risk than women before age 86 years. After age 71, women are at higher risk than men. Risk factors you can control include:  Not getting enough exercise or physical activity.  Being overweight.  Getting too much  fat, sugar, calories, or salt in your diet.  Drinking too much alcohol. SIGNS AND SYMPTOMS Hypertension does not usually cause signs or symptoms. Extremely high blood pressure (hypertensive crisis) may cause headache, anxiety, shortness of breath, and nosebleed. DIAGNOSIS  To check if you have hypertension, your health care provider will measure your blood pressure while you are seated, with your arm held at the level of your heart. It should be measured at least twice using the same arm. Certain conditions can cause a difference in blood pressure between your right and left arms. A blood pressure reading that is higher than normal on one occasion does not mean that you need treatment. If one blood pressure reading is high, ask your health care provider about having it checked again. TREATMENT  Treating high blood pressure includes making lifestyle changes and possibly taking medicine. Living a healthy lifestyle can help lower high blood pressure. You may need to change some of your habits. Lifestyle changes may include:  Following the DASH diet. This diet is high in fruits, vegetables, and whole grains. It is low in salt, red meat, and added sugars.  Getting at least 2 hours of brisk physical activity every week.  Losing weight if necessary.  Not smoking.  Limiting alcoholic beverages.  Learning ways to reduce stress. If lifestyle changes are not enough to get your blood pressure under control, your health care provider may prescribe medicine. You may need to take more than one. Work closely with your health care provider to understand the risks and benefits. HOME CARE INSTRUCTIONS  Have your blood pressure rechecked as directed by your health care provider.   Take medicines only as directed by your health care provider. Follow the directions carefully. Blood pressure medicines must be taken as prescribed. The medicine does not work as well when you skip doses. Skipping doses also puts  you at risk for problems.   Do not smoke.   Monitor your blood pressure at home as directed by your health care provider. SEEK MEDICAL CARE IF:   You think you are having a reaction to medicines taken.  You have  recurrent headaches or feel dizzy.  You have swelling in your ankles.  You have trouble with your vision. SEEK IMMEDIATE MEDICAL CARE IF:  You develop a severe headache or confusion.  You have unusual weakness, numbness, or feel faint.  You have severe chest or abdominal pain.  You vomit repeatedly.  You have trouble breathing. MAKE SURE YOU:   Understand these instructions.  Will watch your condition.  Will get help right away if you are not doing well or get worse. Document Released: 06/25/2005 Document Revised: 11/09/2013 Document Reviewed: 04/17/2013 [facility] Patient Information 2015 [place], [place]. This information is not intended to replace advice given to you by your health care provider. Make sure you discuss any questions you have with your health care provider. DASH Eating Plan DASH stands for "Dietary Approaches to Stop Hypertension." The DASH eating plan is a healthy eating plan that has been shown to reduce high blood pressure (hypertension). Additional health benefits may include reducing the risk of type 2 diabetes mellitus, heart disease, and stroke. The DASH eating plan may also help with weight loss. WHAT DO I NEED TO KNOW ABOUT THE DASH EATING PLAN? For the DASH eating plan, you will follow these general guidelines:  Choose foods with a percent daily value for sodium of less than 5% (as listed on the food label).  Use salt-free seasonings or herbs instead of table salt or sea salt.  Check with your health care provider or pharmacist before using salt substitutes.  Eat lower-sodium products, often labeled as "lower sodium" or "no salt added."  Eat fresh foods.  Eat more vegetables, fruits, and low-fat dairy products.  Choose whole  grains. Look for the word "whole" as the first word in the ingredient list.  Choose fish and skinless chicken or [place] more often than red meat. Limit fish, poultry, and meat to 6 oz (170 g) each day.  Limit sweets, desserts, sugars, and sugary drinks.  Choose heart-healthy fats.  Limit cheese to 1 oz (28 g) per day.  Eat more home-cooked food and less restaurant, buffet, and fast food.  Limit fried foods.  Cook foods using methods other than frying.  Limit canned vegetables. If you do use them, rinse them well to decrease the sodium.  When eating at a restaurant, ask that your food be prepared with less salt, or no salt if possible. WHAT FOODS CAN I EAT? Seek help from a dietitian for individual calorie needs. Grains Whole grain or whole wheat bread. Brown rice. Whole grain or whole wheat pasta. Quinoa, bulgur, and whole grain cereals. Low-sodium cereals. Corn or whole wheat flour tortillas. Whole grain cornbread. Whole grain crackers. Low-sodium crackers. Vegetables Fresh or frozen vegetables (raw, steamed, roasted, or grilled). Low-sodium or reduced-sodium tomato and vegetable juices. Low-sodium or reduced-sodium tomato sauce and paste. Low-sodium or reduced-sodium canned vegetables.  Fruits All fresh, canned (in natural juice), or frozen fruits. Meat and Other Protein Products Ground beef (85% or leaner), grass-fed beef, or beef trimmed of fat. Skinless chicken or [place]. Ground chicken or [place]. Pork trimmed of fat. All fish and seafood. Eggs. Dried beans, peas, or lentils. Unsalted nuts and seeds. Unsalted canned beans. Dairy Low-fat dairy products, such as skim or 1% milk, 2% or reduced-fat cheeses, low-fat ricotta or cottage cheese, or plain low-fat yogurt. Low-sodium or reduced-sodium cheeses. Fats and Oils Tub margarines without trans fats. Light or reduced-fat mayonnaise and salad dressings (reduced sodium). Avocado. Safflower, olive, or canola oils. Natural peanut or  almond butter. Other  Unsalted popcorn and pretzels. The items listed above may not be a complete list of recommended foods or beverages. Contact your dietitian for more options. WHAT FOODS ARE NOT RECOMMENDED? Grains White bread. White pasta. White rice. Refined cornbread. Bagels and croissants. Crackers that contain trans fat. Vegetables Creamed or fried vegetables. Vegetables in a cheese sauce. Regular canned vegetables. Regular canned tomato sauce and paste. Regular tomato and vegetable juices. Fruits Dried fruits. Canned fruit in light or heavy syrup. Fruit juice. Meat and Other Protein Products Fatty cuts of meat. Ribs, chicken wings, bacon, sausage, bologna, salami, chitterlings, fatback, hot dogs, bratwurst, and packaged luncheon meats. Salted nuts and seeds. Canned beans with salt. Dairy Whole or 2% milk, cream, half-and-half, and cream cheese. Whole-fat or sweetened yogurt. Full-fat cheeses or blue cheese. Nondairy creamers and whipped toppings. Processed cheese, cheese spreads, or cheese curds. Condiments Onion and garlic salt, seasoned salt, table salt, and sea salt. Canned and packaged gravies. Worcestershire sauce. Tartar sauce. Barbecue sauce. Teriyaki sauce. Soy sauce, including reduced sodium. Steak sauce. Fish sauce. Oyster sauce. Cocktail sauce. Horseradish. Ketchup and mustard. Meat flavorings and tenderizers. Bouillon cubes. Hot sauce. Tabasco sauce. Marinades. Taco seasonings. Relishes. Fats and Oils Butter, stick margarine, lard, shortening, ghee, and bacon fat. Coconut, palm kernel, or palm oils. Regular salad dressings. Other Pickles and olives. Salted popcorn and pretzels. The items listed above may not be a complete list of foods and beverages to avoid. Contact your dietitian for more information. WHERE CAN I FIND MORE INFORMATION? National Heart, Lung, and Blood Institute: [employer] Document Released: 06/14/2011 Document  Revised: 11/09/2013 Document Reviewed: 04/29/2013 [facility] Patient Information 2015 [place], [place]. This information is not intended to replace advice given to you by your health care provider. Make sure you discuss any questions you have with your health care provider.
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
## 2018-04-23 ENCOUNTER — Ambulatory Visit
Admission: RE | Admit: 2018-04-23 | Discharge: 2018-04-23 | Disposition: A | Discharge status: Home / Self Care | Payer: No Typology Code available for payment source | Source: Ambulatory Visit | Attending: Nurse Practitioner | Admitting: Nurse Practitioner

## 2018-04-23 ENCOUNTER — Other Ambulatory Visit: Payer: Self-pay | Admitting: Nurse Practitioner

## 2018-04-23 DIAGNOSIS — Z0000 Encounter for general adult medical examination without abnormal findings: Secondary | ICD-10-CM

## 2018-08-18 ENCOUNTER — Telehealth: Payer: Self-pay

## 2018-08-18 NOTE — Telephone Encounter (Signed)
Copied from CRM [id number]. Topic: Appointment Scheduling - New Patient >> Aug 18, 2018  1:50 PM Jaquita Rector A wrote: New patient has been scheduled for your office. Provider: saguier Date of Appointment: 08/22/2018  Route to department's PEC pool.

## 2018-08-22 ENCOUNTER — Encounter: Payer: Self-pay | Admitting: Medical

## 2018-08-22 ENCOUNTER — Ambulatory Visit: Payer: 59 | Admitting: Medical

## 2018-08-22 VITALS — BP 117/74 | HR 75 | Temp 98.1°F | Resp 16 | Ht 72.0 in | Wt 211.8 lb

## 2018-08-22 DIAGNOSIS — R109 Unspecified abdominal pain: Secondary | ICD-10-CM | POA: Diagnosis not present

## 2018-08-22 DIAGNOSIS — E785 Hyperlipidemia, unspecified: Secondary | ICD-10-CM | POA: Diagnosis not present

## 2018-08-22 DIAGNOSIS — R195 Other fecal abnormalities: Secondary | ICD-10-CM | POA: Diagnosis not present

## 2018-08-22 LAB — CBC WITH DIFFERENTIAL/PLATELET
Absolute Monocytes: 396 cells/uL (ref 200–950)
Basophils Absolute: 60 cells/uL (ref 0–200)
Basophils Relative: 1.5 %
EOS PCT: 1 %
Eosinophils Absolute: 40 cells/uL (ref 15–500)
HEMATOCRIT: 45.9 % (ref 38.5–50.0)
Hemoglobin: 15.9 g/dL (ref 13.2–17.1)
LYMPHS ABS: 820 {cells}/uL — AB (ref 850–3900)
MCH: 29.8 pg (ref 27.0–33.0)
MCHC: 34.6 g/dL (ref 32.0–36.0)
MCV: 86 fL (ref 80.0–100.0)
MPV: 10.8 fL (ref 7.5–12.5)
Monocytes Relative: 9.9 %
NEUTROS PCT: 67.1 %
Neutro Abs: 2684 cells/uL (ref 1500–7800)
Platelets: 236 10*3/uL (ref 140–400)
RBC: 5.34 10*6/uL (ref 4.20–5.80)
RDW: 12.1 % (ref 11.0–15.0)
Total Lymphocyte: 20.5 %
WBC: 4 10*3/uL (ref 3.8–10.8)

## 2018-08-22 LAB — COMPREHENSIVE METABOLIC PANEL
AG Ratio: 2.6 (calc) — ABNORMAL HIGH (ref 1.0–2.5)
ALT: 14 U/L (ref 9–46)
AST: 17 U/L (ref 10–40)
Albumin: 4.7 g/dL (ref 3.6–5.1)
Alkaline phosphatase (APISO): 48 U/L (ref 36–130)
BILIRUBIN TOTAL: 1 mg/dL (ref 0.2–1.2)
BUN: 15 mg/dL (ref 7–25)
CALCIUM: 9.7 mg/dL (ref 8.6–10.3)
CO2: 29 mmol/L (ref 20–32)
Chloride: 104 mmol/L (ref 98–110)
Creat: 1.26 mg/dL (ref 0.60–1.35)
GLUCOSE: 80 mg/dL (ref 65–99)
Globulin: 1.8 g/dL (calc) — ABNORMAL LOW (ref 1.9–3.7)
POTASSIUM: 5.1 mmol/L (ref 3.5–5.3)
SODIUM: 139 mmol/L (ref 135–146)
TOTAL PROTEIN: 6.5 g/dL (ref 6.1–8.1)

## 2018-08-22 LAB — AMYLASE: AMYLASE: 63 U/L (ref 21–101)

## 2018-08-22 LAB — LIPASE: Lipase: 39 U/L (ref 7–60)

## 2018-08-22 MED ORDER — FAMOTIDINE 20 MG PO TABS: 20.0000 mg | ORAL_TABLET | Freq: Every day | ORAL | 0 refills | Status: AC

## 2018-08-22 NOTE — Patient Instructions (Addendum)
For your history of recent loose stools/diarrhea after eating, I do think that this likely represented food poisoning with the resolution after 2 to 3 days.  Often food poisoning can be self-limiting.  If symptoms/diarrhea persist then we do stool culture studies.  Glad to know that your diarrhea has resolved.  You do have some underlying intermittent abdomen discomfort with bloating.  I did think is a good idea for you to eat  bland healthy diet and take famotidine/Pepcid.  Would recommend that you get a CBC, CMP amylase, lipase and H. pylori breath test.  Will follow those results and let you know those when they are in.  You do have history of some bright red blood when you wiped post 2 to 3 days of loose stool.  This can sometimes happen with rectal mucosal irritation following moderate to severe diarrhea.  Your stool card test today was negative for blood.  Also on inspection of rectal area I did not see any hemorrhoids.  If in the future if you had recurrent bright red blood then would recommend referral to GI.  Follow-up in 3 weeks or as needed.  Also would ask that you can get [place] copies of labs done through [place].

## 2018-08-22 NOTE — Progress Notes (Signed)
Subjective:    Patient ID: Javier Hinton, male    DOB: 1980/12/19, 38 y.o.   MRN: [id number]  HPI   Pt in for first time with me. 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   He is in for some stomach issues. Other day past week after eating [employer] had upset stomach and felt bloated. 2nd day had a lot of loose stools 6-7 days. He states he had some slight bright red blood after severe loose stools. Saw slight blood on toilet paper.  Only slight blood on toilet paper after diarrhea events. No report of hemorrhoids. No family history of crohn or ulcerative colitis.  He has slight bloated and discomfort in his stomach on and off since working new shift. Symptoms are worse on days when he does not work and eats more.  He has some high cholesterol recently on lab screening with work.   Review of Systems  Constitutional: Negative for chills, fatigue and fever.  Respiratory: Negative for chest tightness, shortness of breath and wheezing.   Cardiovascular: Negative for chest pain and palpitations.  Gastrointestinal: Positive for abdominal distention, abdominal pain and diarrhea. Negative for anal bleeding, nausea, rectal pain and vomiting.       See hpi.  Musculoskeletal: Negative for back pain, neck pain and neck stiffness.  Skin: Negative for rash.  Psychiatric/Behavioral: Negative for behavioral problems, confusion and suicidal ideas. The patient is not nervous/anxious.     Past Medical History:  Diagnosis Date  . Chicken pox      Social History   Socioeconomic History  . Marital status: Married    Spouse name: Not on file  . Number of children: Not on file  . Years of education: Not on file  . Highest education level: Not on file  Occupational History  . Not on file  Social Needs  . Financial resource strain: Not on file  . Food insecurity:    Worry: Not on file    Inability: Not on file  . Transportation needs:   Medical: Not on file    Non-medical: Not on file  Tobacco Use  . Smoking status: Never Smoker  . Smokeless tobacco: Current User    Types: Chew  Substance and Sexual Activity  . Alcohol use: Yes    Alcohol/week: 6.0 standard drinks    Types: 6 Cans of beer per week  . Drug use: No  . Sexual activity: Yes    Birth control/protection: Surgical  Lifestyle  . Physical activity:    Days per week: Not on file    Minutes per session: Not on file  . Stress: Not on file  Relationships  . Social connections:    Talks on phone: Not on file    Gets together: Not on file    Attends religious service: Not on file    Active member of club or organization: Not on file    Attends meetings of clubs or organizations: Not on file    Relationship status: Not on file  . Intimate partner violence:    Fear of current or ex partner: Not on file    Emotionally abused: Not on file    Physically abused: Not on file    Forced sexual activity: Not on file  Other Topics Concern  . Not on file  Social History Narrative  . Not on file    Past Surgical History:  Procedure Laterality Date  . REFRACTIVE SURGERY  01.2011   Bilateral  . VASECTOMY  02.2014    Family History  Problem Relation Age of Onset  . Hypertension Father   . Kidney cancer Father        2010  . Healthy Mother   . COPD Maternal Grandfather   . Liver disease Maternal Aunt   . Healthy Sister        x2  . Diabetes Neg Hx   . Heart attack Neg Hx   . Hyperlipidemia Neg Hx   . Sudden death Neg Hx     No Known Allergies  No current outpatient medications on file prior to visit.   No current facility-administered medications on file prior to visit.     BP 117/74   Pulse 75   Temp 98.1 F (36.7 C) (Oral)   Resp 16   Ht 6' (1.829 m)   Wt 211 lb 12.8 oz (96.1 kg)   SpO2 99%   BMI 28.73 kg/m       Objective:   Physical Exam  General Appearance- Not in acute distress.  HEENT Eyes- Scleraeral/Conjuntiva-bilat-  Not Yellow. Mouth & Throat- Normal.  Chest and Lung Exam Auscultation: Breath sounds:-Normal. Adventitious sounds:- No Adventitious sounds.  Cardiovascular Auscultation:Rythm - Regular. Heart Sounds -Normal heart sounds.  Abdomen Inspection:-Inspection Normal.  Palpation/Perucssion: Palpation and Percussion of the abdomen reveal- Non Tender, No Rebound tenderness, No rigidity(Guarding) and No Palpable abdominal masses.  Liver:-Normal.  Spleen:- Normal.   Rectal Anorectal Exam: Stool - Hemoccult of stool/mucous is Heme Negative. External - normal external exam. Internal - normal sphincter tone. No rectal mass. Stool card negative for blood.      Assessment & Plan:  For your history of recent loose stools/diarrhea after eating, I do think that this likely represented food poisoning with the resolution after 2 to 3 days.  Often food poisoning can be self-limiting.  If symptoms/diarrhea persist then we do stool culture studies.  Glad to know that your diarrhea has resolved.  You do have some underlying intermittent abdomen discomfort with bloating.  I did think is a good idea for you to eat  bland healthy diet and take famotidine/Pepcid.  Would recommend that you get a CBC, CMP amylase, lipase and H. pylori breath test.  Will follow those results and let you know those when they are in.  You do have history of some bright red blood when you wiped post 2 to 3 days of loose stool.  This can sometimes happen with rectal mucosal irritation following moderate to severe diarrhea.  Your stool card test today was negative for blood.  Also on inspection of rectal area I did not see any hemorrhoids.  If in the future if  you had recurrent bright red blood then would recommend referral to GI.  Follow-up in 3 weeks or as needed.  Also would ask that you can get [place] copies of labs done through [place].  Esperanza Richters, PA-C

## 2018-08-25 LAB — H. PYLORI BREATH TEST: H. pylori Breath Test: NOT DETECTED

## 2018-09-09 ENCOUNTER — Ambulatory Visit: Payer: 59 | Admitting: Medical

## 2018-09-09 ENCOUNTER — Encounter: Payer: Self-pay | Admitting: Medical

## 2018-09-09 VITALS — BP 113/79 | HR 54 | Temp 98.4°F | Resp 16 | Ht 72.0 in | Wt 213.2 lb

## 2018-09-09 DIAGNOSIS — K921 Melena: Secondary | ICD-10-CM

## 2018-09-09 DIAGNOSIS — R109 Unspecified abdominal pain: Secondary | ICD-10-CM

## 2018-09-09 NOTE — Patient Instructions (Addendum)
Glad to hear that all your abdomen/GI symptoms have resolved.  You only report faint transient discomfort at the past intermittently.  Would recommend that you continue to eat bland healthy foods.  Definitely avoid any greasy foods.  Continue with probiotic.  If you get any recurrent bright red blood in the future as you described previously or if you get any black stools then let us know(also caution on any rapid weight loss without any effort).  Under that scenario would recommend getting labs to check your hemoglobin and hematocrit as well as referring you to a gastroenterologist.  Would recommend getting annual physicals which would include a fasting lipid panel.  You can get that done at your convenience over the next year otherwise follow-up as needed.

## 2018-09-09 NOTE — Progress Notes (Signed)
Subjective:    Patient ID: Javier Hinton, male    DOB: [birth year], 38 y.o.   MRN: [id number]  HPI  Pt in for follow up. He states his symptoms almost cleared up close to 100%. States general discomfort almost went away completely. Occasionally will have faint very transient low level flank pain. He did start probiotics. He reports no black or bloody stool. The former bright red blood that he saw was on wiping as explained before. No fever, no chills, no nausea, no vomiting, no abnormal weight loss, no family history of UC or Crohns. He notes symptoms started around eating Venita Sheffield. He did use famotidine for about one week then stopped.   Review of Systems  Constitutional: Negative for chills, fatigue and fever.  Respiratory: Negative for cough, chest tightness, shortness of breath and wheezing.   Cardiovascular: Negative for chest pain and palpitations.  Gastrointestinal: Negative for abdominal pain, blood in stool, constipation, nausea and vomiting.  Skin: Negative for rash.  Hematological: Negative for adenopathy. Does not bruise/bleed easily.  Psychiatric/Behavioral: Negative for behavioral problems. The patient is not nervous/anxious.     Past Medical History:  Diagnosis Date  . Chicken pox      Social History   Socioeconomic History  . Marital status: Married    Spouse name: Not on file  . Number of children: Not on file  . Years of education: Not on file  . Highest education level: Not on file  Occupational History  . Not on file  Social Needs  . Financial resource strain: Not on file  . Food insecurity:    Worry: Not on file    Inability: Not on file  . Transportation needs:    Medical: Not on file    Non-medical: Not on file  Tobacco Use  . Smoking status: Never Smoker  . Smokeless tobacco: Current User    Types: Chew  Substance and Sexual Activity  . Alcohol use: Yes    Alcohol/week: 6.0 standard drinks    Types: 6 Cans of beer per week  . Drug use:  No  . Sexual activity: Yes    Birth control/protection: Surgical  Lifestyle  . Physical activity:    Days per week: Not on file    Minutes per session: Not on file  . Stress: Not on file  Relationships  . Social connections:    Talks on phone: Not on file    Gets together: Not on file    Attends religious service: Not on file    Active member of club or organization: Not on file    Attends meetings of clubs or organizations: Not on file    Relationship status: Not on file  . Intimate partner violence:    Fear of current or ex partner: Not on file    Emotionally abused: Not on file    Physically abused: Not on file    Forced sexual activity: Not on file  Other Topics Concern  . Not on file  Social History Narrative  . Not on file    Past Surgical History:  Procedure Laterality Date  . REFRACTIVE SURGERY  01.2011   Bilateral  . VASECTOMY  02.2014    Family History  Problem Relation Age of Onset  . Hypertension Father   . Kidney cancer Father        2010  . Healthy Mother   . COPD Maternal Grandfather   . Liver disease Maternal Aunt   . Healthy  Sister        x2  . Diabetes Neg Hx   . Heart attack Neg Hx   . Hyperlipidemia Neg Hx   . Sudden death Neg Hx     No Known Allergies  Current Outpatient Medications on File Prior to Visit  Medication Sig Dispense Refill  . famotidine (PEPCID) 20 MG tablet Take 1 tablet (20 mg total) by mouth daily. 30 tablet 0   No current facility-administered medications on file prior to visit.     BP 113/79   Pulse (!) 54   Temp 98.4 F (36.9 C) (Oral)   Resp 16   Ht 6' (1.829 m)   Wt 213 lb 3.2 oz (96.7 kg)   SpO2 100%   BMI 28.92 kg/m       Objective:   Physical Exam  General Appearance- Not in acute distress.  HEENT Eyes- Scleraeral/Conjuntiva-bilat- Not Yellow. Mouth & Throat- Normal.  Chest and Lung Exam Auscultation: Breath sounds:-Normal. Adventitious sounds:- No Adventitious  sounds.  Cardiovascular Auscultation:Rythm - Regular. Heart Sounds -Normal heart sounds.  Abdomen Inspection:-Inspection Normal.  Palpation/Perucssion: Palpation and Percussion of the abdomen reveal- Non Tender, No Rebound tenderness, No rigidity(Guarding) and No Palpable abdominal masses.  Liver:-Normal.  Spleen:- Normal.   Back- no cva tenderness.      Assessment & Plan:  Glad to hear that all your abdomen/GI symptoms have resolved.  You only report faint transient discomfort at the past intermittently.  Would recommend that you continue to eat bland healthy foods.  Definitely avoid any greasy foods.  Continue with probiotic.  If you get any recurrent bright red blood in the future as you described previously or if you get any black stools then let us know.  Under that scenario would recommend getting labs to check your hemoglobin and hematocrit as well as referring you to a gastroenterologist.  Would recommend getting annual physicals which would include a fasting lipid panel.  You can get that done at your convenience over the next year otherwise follow-up as needed.  Esperanza Richters, PA-C

## 2019-04-12 IMAGING — CR DG CHEST 1V
1 series · 1 of 1 positions shown · non-contrast
Comparison: 11/23/2010

CLINICAL DATA: Routine physical

EXAM:
CHEST  1 VIEW

[w chest pa]
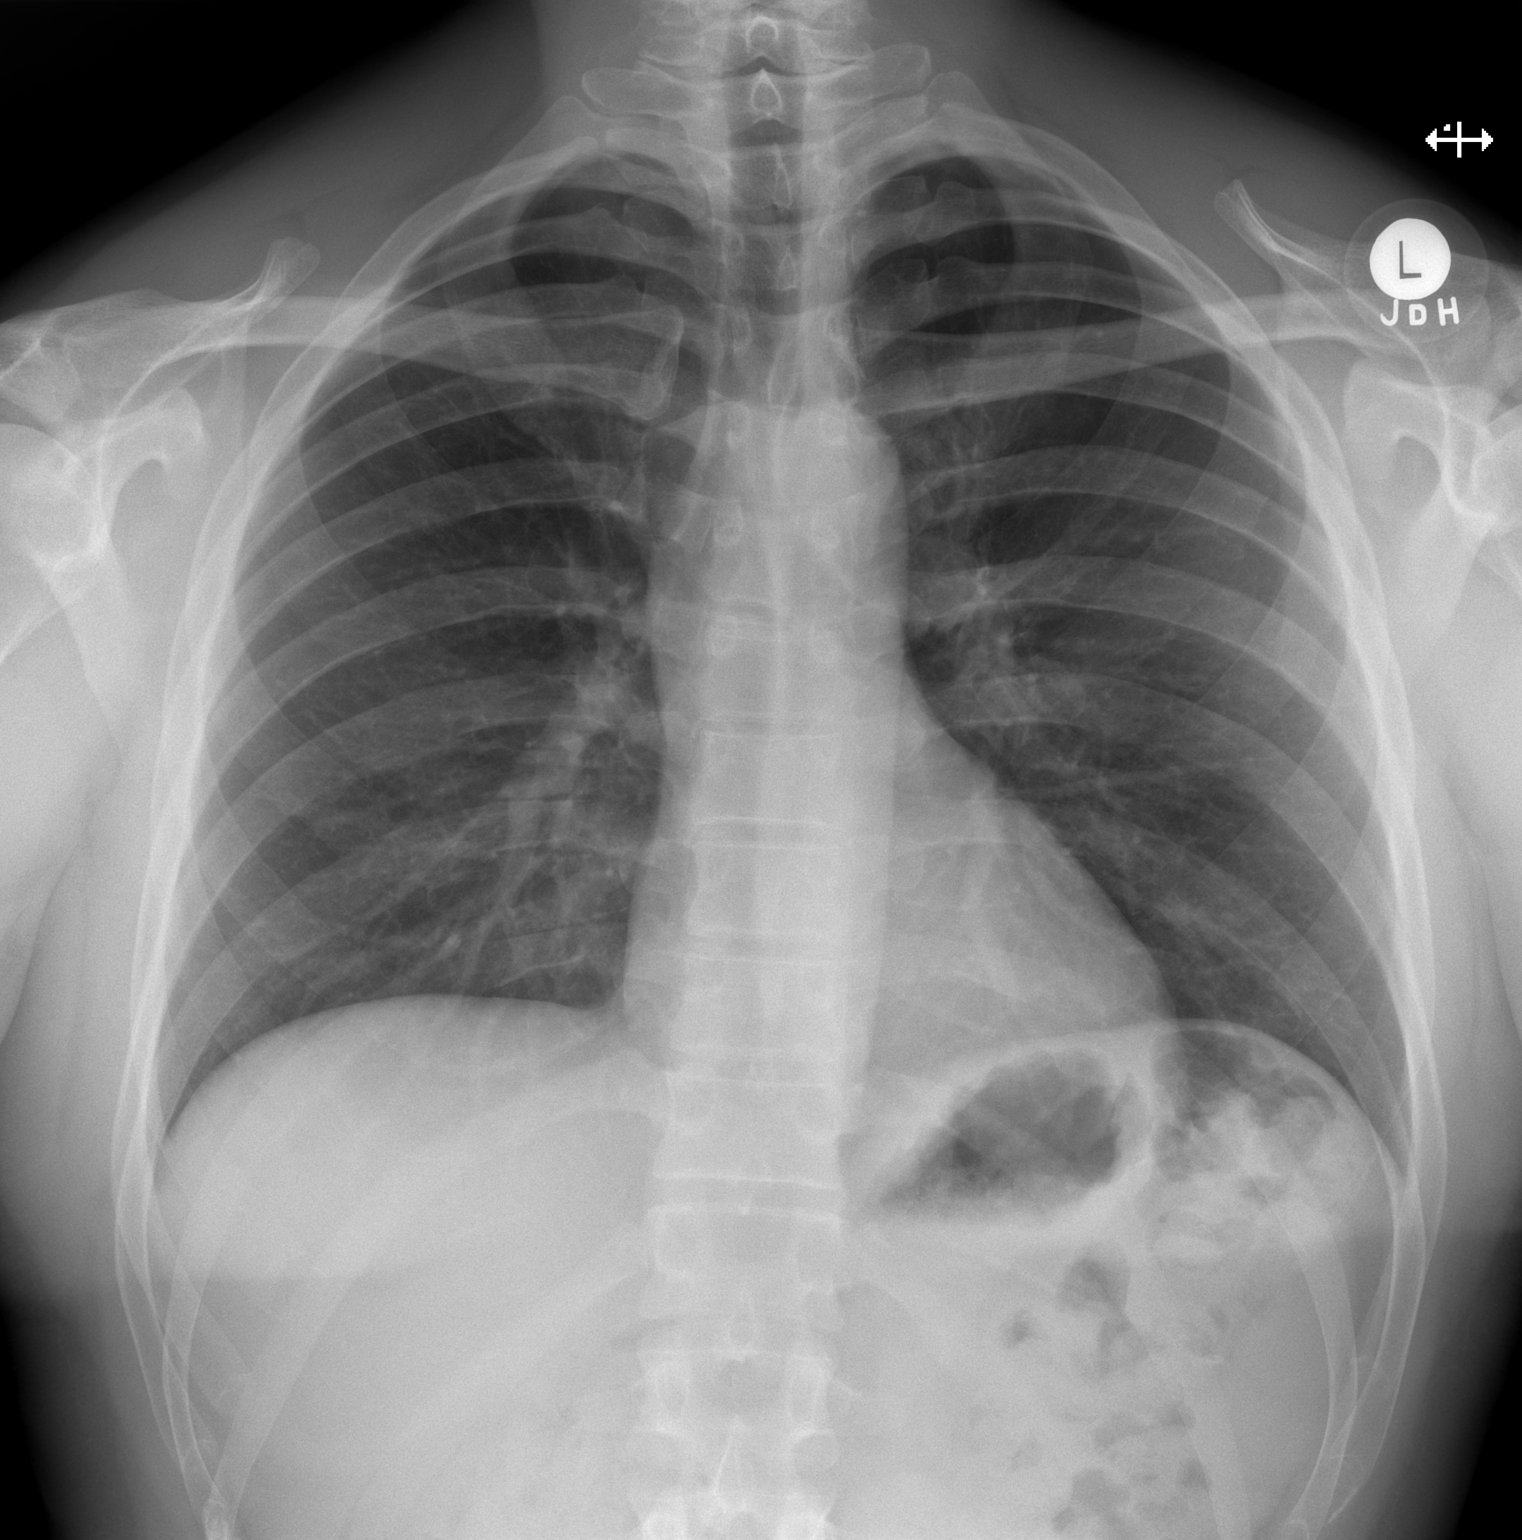

[1 of 1 positions shown; findings below may reference images not displayed]

FINDINGS: The heart size and mediastinal contours are within normal limits.
Both lungs are clear. The visualized skeletal structures are
unremarkable.
IMPRESSION: No active disease.

## 2021-05-30 ENCOUNTER — Encounter: Payer: Self-pay | Admitting: Medical

## 2021-05-30 ENCOUNTER — Ambulatory Visit (INDEPENDENT_AMBULATORY_CARE_PROVIDER_SITE_OTHER): Payer: 59 | Admitting: Medical

## 2021-05-30 ENCOUNTER — Other Ambulatory Visit: Payer: Self-pay

## 2021-05-30 VITALS — BP 110/68 | HR 94 | Ht 72.0 in | Wt 185.8 lb

## 2021-05-30 DIAGNOSIS — R0781 Pleurodynia: Secondary | ICD-10-CM | POA: Diagnosis not present

## 2021-05-30 DIAGNOSIS — Z0000 Encounter for general adult medical examination without abnormal findings: Secondary | ICD-10-CM

## 2021-05-30 DIAGNOSIS — R5383 Other fatigue: Secondary | ICD-10-CM

## 2021-05-30 DIAGNOSIS — Z113 Encounter for screening for infections with a predominantly sexual mode of transmission: Secondary | ICD-10-CM

## 2021-05-30 DIAGNOSIS — R6882 Decreased libido: Secondary | ICD-10-CM

## 2021-05-30 LAB — COMPREHENSIVE METABOLIC PANEL
ALT: 11 U/L (ref 0–53)
AST: 18 U/L (ref 0–37)
Albumin: 4.6 g/dL (ref 3.5–5.2)
Alkaline Phosphatase: 58 U/L (ref 39–117)
BUN: 15 mg/dL (ref 6–23)
CO2: 29 mEq/L (ref 19–32)
Calcium: 9.8 mg/dL (ref 8.4–10.5)
Chloride: 103 mEq/L (ref 96–112)
Creatinine, Ser: 1.16 mg/dL (ref 0.40–1.50)
GFR: 78.56 mL/min (ref >60.00)
Glucose, Bld: 86 mg/dL (ref 70–99)
Potassium: 4.5 mEq/L (ref 3.5–5.1)
Sodium: 138 mEq/L (ref 135–145)
Total Bilirubin: 0.8 mg/dL (ref 0.2–1.2)
Total Protein: 6.4 g/dL (ref 6.0–8.3)

## 2021-05-30 LAB — CBC WITH DIFFERENTIAL/PLATELET
Basophils Absolute: 0 10*3/uL (ref 0.0–0.1)
Basophils Relative: 1 % (ref 0.0–3.0)
Eosinophils Absolute: 0 10*3/uL (ref 0.0–0.7)
Eosinophils Relative: 1.1 % (ref 0.0–5.0)
HCT: 42.5 % (ref 39.0–52.0)
Hemoglobin: 14.2 g/dL (ref 13.0–17.0)
Lymphocytes Relative: 20.7 % (ref 12.0–46.0)
Lymphs Abs: 0.9 10*3/uL (ref 0.7–4.0)
MCHC: 33.5 g/dL (ref 30.0–36.0)
MCV: 89.5 fl (ref 78.0–100.0)
Monocytes Absolute: 0.3 10*3/uL (ref 0.1–1.0)
Monocytes Relative: 7.3 % (ref 3.0–12.0)
Neutro Abs: 2.9 10*3/uL (ref 1.4–7.7)
Neutrophils Relative %: 69.9 % (ref 43.0–77.0)
Platelets: 245 10*3/uL (ref 150.0–400.0)
RBC: 4.75 Mil/uL (ref 4.22–5.81)
RDW: 12.8 % (ref 11.5–15.5)
WBC: 4.2 10*3/uL (ref 4.0–10.5)

## 2021-05-30 LAB — LIPID PANEL
Cholesterol: 170 mg/dL (ref 0–200)
HDL: 62.2 mg/dL (ref >39.00)
LDL Cholesterol: 92 mg/dL (ref 0–99)
NonHDL: 107.57
Total CHOL/HDL Ratio: 3
Triglycerides: 77 mg/dL (ref 0.0–149.0)
VLDL: 15.4 mg/dL (ref 0.0–40.0)

## 2021-05-30 LAB — VITAMIN B12: Vitamin B-12: 342 pg/mL (ref 211–911)

## 2021-05-30 LAB — T4, FREE: Free T4: 0.99 ng/dL (ref 0.60–1.60)

## 2021-05-30 LAB — TSH: TSH: 1.88 u[IU]/mL (ref 0.35–5.50)

## 2021-05-30 NOTE — Progress Notes (Signed)
Subjective:    Patient ID: Javier Hinton, male    DOB: Jul 16, 1980, 40 y.o.   MRN: [id number]  HPI  Pt is here for wellness exam.   Pt ate protein bars today for breakfast.   Pt  has lost weight. He changed his diet be overall healthy. He was eating a lot of fried foods in past. He was eating a lot of fast foods twice a day. He describes purposeful weight loss.   He states he was 195 lb then got over Halloween week and lost appetite for 2 weeks when had flu. He states his appetite is now getting back to normal.  Pt has low energy and decreased libido. He noticed this around time started Ashwa Ganda. Pt thinks low T. He has had 2 children.    Review of Systems  Constitutional:  Positive for fatigue.  HENT:  Negative for congestion, ear discharge, ear pain and facial swelling.   Respiratory:  Negative for cough, choking, shortness of breath and wheezing.        Some left lower mid rib pain for one month since he had flu. Getting better but at times worse with movement.   Cardiovascular:  Negative for chest pain and palpitations.  Gastrointestinal:  Negative for abdominal distention, abdominal pain, blood in stool, nausea, rectal pain and vomiting.  Genitourinary:  Negative for dysuria, frequency, hematuria and penile pain.       Low libido Some ED.  Musculoskeletal:  Negative for back pain.  Skin:  Negative for rash.    Past Medical History:  Diagnosis Date   Chicken pox      Social History   Socioeconomic History   Marital status: Married    Spouse name: Not on file   Number of children: Not on file   Years of education: Not on file   Highest education level: Not on file  Occupational History   Not on file  Tobacco Use   Smoking status: Never   Smokeless tobacco: Current    Types: Chew  Substance and Sexual Activity   Alcohol use: Yes    Alcohol/week: 6.0 standard drinks    Types: 6 Cans of beer per week   Drug use: No   Sexual activity: Yes    Birth  control/protection: Surgical  Other Topics Concern   Not on file  Social History Narrative   Not on file   Social Determinants of Health   Financial Resource Strain: Not on file  Food Insecurity: Not on file  Transportation Needs: Not on file  Physical Activity: Not on file  Stress: Not on file  Social Connections: Not on file  Intimate Partner Violence: Not on file    Past Surgical History:  Procedure Laterality Date   REFRACTIVE SURGERY  01.2011   Bilateral   VASECTOMY  02.2014    Family History  Problem Relation Age of Onset   Hypertension Father    Kidney cancer Father        81   Healthy Mother    COPD Maternal Grandfather    Liver disease Maternal Aunt    Healthy Sister        x2   Diabetes Neg Hx    Heart attack Neg Hx    Hyperlipidemia Neg Hx    Sudden death Neg Hx     No Known Allergies  Current Outpatient Medications on File Prior to Visit  Medication Sig Dispense Refill   famotidine (PEPCID) 20 MG tablet Take  1 tablet (20 mg total) by mouth daily. 30 tablet 0   No current facility-administered medications on file prior to visit.    Pulse 94   Ht 6' (1.829 m)   SpO2 98%   BMI 28.92 kg/m       Objective:   Physical Exam  General Mental Status- Alert. General Appearance- Not in acute distress.   Skin General: Color- Normal Color. Moisture- Normal Moisture.  Neck Carotid Arteries- Normal color. Moisture- Normal Moisture. No carotid bruits. No JVD.  Chest and Lung Exam Auscultation: Breath Sounds:-Normal.  Cardiovascular Auscultation:Rythm- Regular. Murmurs & Other Heart Sounds:Auscultation of the heart reveals- No Murmurs.  Abdomen Inspection:-Inspeection Normal. Palpation/Percussion:Note:No mass. Palpation and Percussion of the abdomen reveal- Non Tender, Non Distended + BS, no rebound or guarding.   Neurologic Cranial Nerve exam:- CN III-XII intact(No nystagmus), symmetric smile. Strength:- 5/5 equal and symmetric strength  both upper and lower extremities.       Assessment & Plan:   For you wellness exam today I have ordered cbc, cmp, hiv, and lipid panel.  Recommend exercise and healthy diet.  We will let you know lab results as they come in.  Follow up date appointment will be determined after lab review.    For fatigue will get tsh, t4, b12, b1 and vit D.  Can do future testosterone panel in future but want that done early morning 7-8:30 am.  Mackie Pai, PA-C    [phone] charge as did address fatigue, low libido, low testoserone concerns and rib pain.

## 2021-05-30 NOTE — Patient Instructions (Addendum)
For you wellness exam today I have ordered cbc, cmp, hiv, and lipid panel.  Recommend exercise and healthy diet.  We will let you know lab results as they come in.  Follow up date appointment will be determined after lab review.    For fatigue will get tsh, t4, b12, b1 and vit D.  Can do future testosterone panel in future but want that done early morning 7-8:30 am.   Placing rib xray with chest. Can get done within a month if rib pain persists. Muscle strain from severe cough vs rib injury.(Remote possible infection)  For weight loss during illness/flu. Want to see able to get back up to 195 lb. Good job on [employer] loss/change in diet.  Preventive Care 49-61 Years Old, Male Preventive care refers to lifestyle choices and visits with your health care provider that can promote health and wellness. Preventive care visits are also called wellness exams. What can I expect for my preventive care visit? Counseling During your preventive care visit, your health care provider may ask about your: Medical history, including: Past medical problems. Family medical history. Current health, including: Emotional well-being. Home life and relationship well-being. Sexual activity. Lifestyle, including: Alcohol, nicotine or tobacco, and drug use. Access to firearms. Diet, exercise, and sleep habits. Safety issues such as seatbelt and bike helmet use. Sunscreen use. 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 Physical exam Your health care provider will check your: Height and weight. These may be used to calculate your BMI (body mass index). BMI is a measurement that tells if you are at a healthy weight. Waist circumference. This measures the distance around your waistline. This measurement also tells if you are at a healthy weight and may help predict your risk of certain diseases, such as type 2 diabetes and high blood pressure. Heart rate and blood pressure. Body temperature. Skin for abnormal spots. What  immunizations do I need? Vaccines are usually given at various ages, according to a schedule. Your health care provider will recommend vaccines for you based on your age, medical history, and lifestyle or other factors, such as travel or where you work. What tests do I need? Screening Your health care provider may recommend screening tests for certain conditions. This may include: Lipid and cholesterol levels. Diabetes screening. This is done by checking your blood sugar (glucose) after you have not eaten for a while (fasting). Hepatitis B test. Hepatitis C test. HIV (human immunodeficiency virus) test. STI (sexually transmitted infection) testing, if you are at risk. Lung cancer screening. Prostate cancer screening. Colorectal cancer screening. Talk with your health care provider about your test results, treatment options, and if necessary, the need for more tests. Follow these instructions at home: Eating and drinking  Eat a diet that includes fresh fruits and vegetables, whole grains, lean protein, and low-fat dairy products. Take vitamin and mineral supplements as recommended by your health care provider. Do not drink alcohol if your health care provider tells you not to drink. If you drink alcohol: Limit how much you have to 0-2 drinks a day. Know how much alcohol is in your drink. In the [place] one drink equals one 12 oz bottle of beer (355 mL), one 5 oz glass of wine (148 mL), or one 1 oz glass of hard liquor (44 mL). Lifestyle Brush your teeth every morning and night with fluoride toothpaste. Floss one time each day. Exercise for at least 30 minutes 5 or more days each week. Do not use any products that contain nicotine or  tobacco. These products include cigarettes, chewing tobacco, and vaping devices, such as e-cigarettes. If you need help quitting, ask your health care provider. Do not use drugs. If you are sexually active, practice safe sex. Use a condom or other form of  protection to prevent STIs. Take aspirin only as told by your health care provider. Make sure that you understand how much to take and what form to take. Work with your health care provider to find out whether it is safe and beneficial for you to take aspirin daily. Find healthy ways to manage stress, such as: Meditation, yoga, or listening to music. Journaling. Talking to a trusted person. Spending time with friends and family. Minimize exposure to UV radiation to reduce your risk of skin cancer. Safety Always wear your seat belt while driving or riding in a vehicle. Do not drive: If you have been drinking alcohol. Do not ride with someone who has been drinking. When you are tired or distracted. While texting. If you have been using any mind-altering substances or drugs. Wear a helmet and other protective equipment during sports activities. If you have firearms in your house, make sure you follow all gun safety procedures. What's next? Go to your health care provider once a year for an annual wellness visit. Ask your health care provider how often you should have your eyes and teeth checked. Stay up to date on all vaccines. This information is not intended to replace advice given to you by your health care provider. Make sure you discuss any questions you have with your health care provider. Document Revised: 12/21/2020 Document Reviewed: 12/21/2020 Elsevier Patient Education  2022 [employer].

## 2021-05-30 NOTE — Addendum Note (Signed)
Addended by: Gwenevere Abbot on: 05/30/2021 07:20 PM   Modules accepted: Orders

## 2021-06-04 LAB — VITAMIN D 1,25 DIHYDROXY
Vitamin D 1, 25 (OH)2 Total: 51 pg/mL (ref 18–72)
Vitamin D2 1, 25 (OH)2: <8 pg/mL
Vitamin D3 1, 25 (OH)2: 51 pg/mL

## 2021-06-04 LAB — HIV ANTIBODY (ROUTINE TESTING W REFLEX): HIV 1&2 Ab, 4th Generation: NONREACTIVE

## 2021-06-05 LAB — VITAMIN B1: Vitamin B1 (Thiamine): 8 nmol/L (ref 8–30)

## 2021-06-06 ENCOUNTER — Other Ambulatory Visit: Payer: Self-pay

## 2021-06-06 ENCOUNTER — Other Ambulatory Visit (INDEPENDENT_AMBULATORY_CARE_PROVIDER_SITE_OTHER): Payer: 59

## 2021-06-06 DIAGNOSIS — R5383 Other fatigue: Secondary | ICD-10-CM | POA: Diagnosis not present

## 2021-06-06 DIAGNOSIS — R6882 Decreased libido: Secondary | ICD-10-CM

## 2021-06-07 LAB — TESTOSTERONE TOTAL,FREE,BIO, MALES
Albumin: 4.3 g/dL (ref 3.6–5.1)
Sex Hormone Binding: 70 nmol/L — ABNORMAL HIGH (ref 10–50)
Testosterone, Bioavailable: 122.9 ng/dL (ref 110.0–575.0)
Testosterone, Free: 62.4 pg/mL (ref 46.0–224.0)
Testosterone: 841 ng/dL — ABNORMAL HIGH (ref 250–827)

## 2021-10-12 ENCOUNTER — Telehealth: Payer: Self-pay | Admitting: Medical

## 2021-10-12 NOTE — Telephone Encounter (Signed)
Patient states his insurance is not wanting to cover his Vitamin D lab drawn on Nov of 2022, and its costing him $300+. He spoke to [facility] and they told him that the PCP needs to provide a medical necessity form to [facility] so [facility] can then re-send it to [place] [employer] [facility] and they would fix it. He was unsure of where it needed to be sent but said we can call [facility] at [phone]. Please advise.  ?

## 2024-02-21 ENCOUNTER — Other Ambulatory Visit (HOSPITAL_BASED_OUTPATIENT_CLINIC_OR_DEPARTMENT_OTHER): Payer: Self-pay

## 2024-02-21 MED ORDER — CYCLOBENZAPRINE HCL 10 MG PO TABS
10.0000 mg | ORAL_TABLET | Freq: Three times a day (TID) | ORAL | 0 refills | Status: DC | PRN
  Filled 2024-02-21: qty 20, 7d supply, fill #0

## 2024-03-02 ENCOUNTER — Other Ambulatory Visit (HOSPITAL_BASED_OUTPATIENT_CLINIC_OR_DEPARTMENT_OTHER): Payer: Self-pay

## 2024-03-19 ENCOUNTER — Other Ambulatory Visit (HOSPITAL_BASED_OUTPATIENT_CLINIC_OR_DEPARTMENT_OTHER): Payer: Self-pay

## 2024-03-19 MED ORDER — CELECOXIB 200 MG PO CAPS
200.0000 mg | ORAL_CAPSULE | Freq: Two times a day (BID) | ORAL | 1 refills | Status: AC
  Filled 2024-03-19: qty 60, 30d supply, fill #0
